# Patient Record
Sex: Female | Born: 1945 | Race: White | Hispanic: No | Marital: Single | Smoking: Former smoker
Health system: Southern US, Community
[De-identification: ages and names within clinical notes are randomized; demographics above are authoritative.]

## PROBLEM LIST (undated history)

## (undated) DIAGNOSIS — J449 Chronic obstructive pulmonary disease, unspecified: Secondary | ICD-10-CM

## (undated) DIAGNOSIS — I10 Essential (primary) hypertension: Secondary | ICD-10-CM

## (undated) DIAGNOSIS — F32A Depression, unspecified: Secondary | ICD-10-CM

## (undated) DIAGNOSIS — F329 Major depressive disorder, single episode, unspecified: Secondary | ICD-10-CM

## (undated) DIAGNOSIS — T7840XA Allergy, unspecified, initial encounter: Secondary | ICD-10-CM

## (undated) DIAGNOSIS — M1990 Unspecified osteoarthritis, unspecified site: Secondary | ICD-10-CM

## (undated) HISTORY — DX: Depression, unspecified: F32.A

## (undated) HISTORY — PX: ABDOMINAL HYSTERECTOMY: SHX81

## (undated) HISTORY — DX: Unspecified osteoarthritis, unspecified site: M19.90

## (undated) HISTORY — DX: Chronic obstructive pulmonary disease, unspecified: J44.9

## (undated) HISTORY — DX: Essential (primary) hypertension: I10

## (undated) HISTORY — DX: Major depressive disorder, single episode, unspecified: F32.9

## (undated) HISTORY — DX: Allergy, unspecified, initial encounter: T78.40XA

---

## 2000-02-14 ENCOUNTER — Encounter: Admission: RE | Admit: 2000-02-14 | Discharge: 2000-05-14 | Payer: Self-pay | Admitting: Orthopedic Surgery

## 2002-04-16 ENCOUNTER — Encounter: Admission: RE | Admit: 2002-04-16 | Discharge: 2002-04-16 | Payer: Self-pay | Admitting: Internal Medicine

## 2002-04-16 ENCOUNTER — Encounter: Payer: Self-pay | Admitting: Internal Medicine

## 2003-04-29 ENCOUNTER — Encounter: Admission: RE | Admit: 2003-04-29 | Discharge: 2003-04-29 | Payer: Self-pay | Admitting: Internal Medicine

## 2003-10-05 ENCOUNTER — Emergency Department (HOSPITAL_COMMUNITY): Admission: EM | Admit: 2003-10-05 | Discharge: 2003-10-05 | Payer: Self-pay | Admitting: Emergency Medicine

## 2004-04-29 ENCOUNTER — Ambulatory Visit (HOSPITAL_COMMUNITY): Admission: RE | Admit: 2004-04-29 | Discharge: 2004-04-29 | Payer: Self-pay | Admitting: Internal Medicine

## 2005-05-10 ENCOUNTER — Ambulatory Visit (HOSPITAL_COMMUNITY): Admission: RE | Admit: 2005-05-10 | Discharge: 2005-05-10 | Payer: Self-pay | Admitting: Internal Medicine

## 2006-05-25 ENCOUNTER — Ambulatory Visit (HOSPITAL_COMMUNITY): Admission: RE | Admit: 2006-05-25 | Discharge: 2006-05-25 | Payer: Self-pay | Admitting: Internal Medicine

## 2006-06-02 ENCOUNTER — Encounter: Admission: RE | Admit: 2006-06-02 | Discharge: 2006-06-02 | Payer: Self-pay | Admitting: Internal Medicine

## 2007-06-21 ENCOUNTER — Ambulatory Visit (HOSPITAL_COMMUNITY): Admission: RE | Admit: 2007-06-21 | Discharge: 2007-06-21 | Payer: Self-pay | Admitting: Internal Medicine

## 2007-08-17 ENCOUNTER — Encounter (INDEPENDENT_AMBULATORY_CARE_PROVIDER_SITE_OTHER): Payer: Self-pay | Admitting: Emergency Medicine

## 2007-08-17 ENCOUNTER — Ambulatory Visit: Payer: Self-pay | Admitting: Vascular Surgery

## 2007-08-17 ENCOUNTER — Emergency Department (HOSPITAL_COMMUNITY): Admission: EM | Admit: 2007-08-17 | Discharge: 2007-08-17 | Payer: Self-pay | Admitting: Emergency Medicine

## 2008-06-24 ENCOUNTER — Ambulatory Visit (HOSPITAL_COMMUNITY): Admission: RE | Admit: 2008-06-24 | Discharge: 2008-06-24 | Payer: Self-pay | Admitting: Internal Medicine

## 2009-07-07 ENCOUNTER — Ambulatory Visit (HOSPITAL_COMMUNITY): Admission: RE | Admit: 2009-07-07 | Discharge: 2009-07-07 | Payer: Self-pay | Admitting: Internal Medicine

## 2010-12-01 ENCOUNTER — Other Ambulatory Visit (HOSPITAL_COMMUNITY): Payer: Self-pay | Admitting: *Deleted

## 2010-12-01 DIAGNOSIS — Z1231 Encounter for screening mammogram for malignant neoplasm of breast: Secondary | ICD-10-CM

## 2010-12-08 ENCOUNTER — Ambulatory Visit (HOSPITAL_COMMUNITY)
Admission: RE | Admit: 2010-12-08 | Discharge: 2010-12-08 | Disposition: A | Discharge status: Home / Self Care | Payer: Medicare Other | Source: Ambulatory Visit | Attending: *Deleted | Admitting: *Deleted

## 2010-12-08 DIAGNOSIS — Z1231 Encounter for screening mammogram for malignant neoplasm of breast: Secondary | ICD-10-CM | POA: Insufficient documentation

## 2011-01-25 LAB — COMPREHENSIVE METABOLIC PANEL
AST: 21
Alkaline Phosphatase: 77
CO2: 27
Calcium: 9.7
Creatinine, Ser: 0.82
GFR calc non Af Amer: >60
Total Protein: 7.3

## 2011-01-25 LAB — DIFFERENTIAL
Monocytes Absolute: 0.5
Monocytes Relative: 6
Neutro Abs: 5.5
Neutrophils Relative %: 70

## 2011-01-25 LAB — CBC
HCT: 42
Hemoglobin: 14.3
MCV: 86.8
RBC: 4.84
RDW: 13.6
WBC: 7.9

## 2011-05-24 ENCOUNTER — Ambulatory Visit (INDEPENDENT_AMBULATORY_CARE_PROVIDER_SITE_OTHER): Payer: BC Managed Care – PPO

## 2011-05-24 DIAGNOSIS — K5730 Diverticulosis of large intestine without perforation or abscess without bleeding: Secondary | ICD-10-CM

## 2011-05-24 DIAGNOSIS — R319 Hematuria, unspecified: Secondary | ICD-10-CM

## 2011-12-22 ENCOUNTER — Other Ambulatory Visit (HOSPITAL_COMMUNITY): Payer: Self-pay | Admitting: Internal Medicine

## 2011-12-22 DIAGNOSIS — Z1231 Encounter for screening mammogram for malignant neoplasm of breast: Secondary | ICD-10-CM

## 2012-01-05 ENCOUNTER — Ambulatory Visit (HOSPITAL_COMMUNITY)
Admission: RE | Admit: 2012-01-05 | Discharge: 2012-01-05 | Disposition: A | Discharge status: Home / Self Care | Payer: Medicare Other | Source: Ambulatory Visit | Attending: Internal Medicine | Admitting: Internal Medicine

## 2012-01-05 DIAGNOSIS — Z1231 Encounter for screening mammogram for malignant neoplasm of breast: Secondary | ICD-10-CM | POA: Insufficient documentation

## 2012-03-03 ENCOUNTER — Ambulatory Visit (INDEPENDENT_AMBULATORY_CARE_PROVIDER_SITE_OTHER): Payer: BC Managed Care – PPO | Admitting: Physician Assistant

## 2012-03-03 VITALS — BP 130/90 | HR 80 | Temp 98.8°F | Resp 18 | Wt 237.0 lb

## 2012-03-03 DIAGNOSIS — I10 Essential (primary) hypertension: Secondary | ICD-10-CM | POA: Insufficient documentation

## 2012-03-03 DIAGNOSIS — R319 Hematuria, unspecified: Secondary | ICD-10-CM

## 2012-03-03 DIAGNOSIS — H8390 Unspecified disease of inner ear, unspecified ear: Secondary | ICD-10-CM | POA: Insufficient documentation

## 2012-03-03 DIAGNOSIS — F419 Anxiety disorder, unspecified: Secondary | ICD-10-CM | POA: Insufficient documentation

## 2012-03-03 DIAGNOSIS — M1990 Unspecified osteoarthritis, unspecified site: Secondary | ICD-10-CM | POA: Insufficient documentation

## 2012-03-03 LAB — POCT URINALYSIS DIPSTICK
Bilirubin, UA: NEGATIVE
Ketones, UA: NEGATIVE
Nitrite, UA: NEGATIVE
Spec Grav, UA: 1.02
Urobilinogen, UA: 0.2
pH, UA: 7

## 2012-03-03 LAB — POCT UA - MICROSCOPIC ONLY
Casts, Ur, LPF, POC: NEGATIVE
Crystals, Ur, HPF, POC: NEGATIVE
Yeast, UA: NEGATIVE

## 2012-03-03 MED ORDER — CIPROFLOXACIN HCL 500 MG PO TABS: 500.0000 mg | ORAL_TABLET | Freq: Two times a day (BID) | ORAL | Status: DC

## 2012-03-03 NOTE — Patient Instructions (Signed)
Get plenty of rest and drink at least 64 ounces of water daily. 

## 2012-03-03 NOTE — Progress Notes (Signed)
Subjective:    Patient ID: Kathy Lucas, female    DOB: [birth year], 66 y.o.   MRN: [id number]  HPI This 66 y.o. female presents for evaluation of "a kidney infection."  "I also have diverticulitis and over the past few weeks it's been flared up.  The past few days I've been impacted.  I used a Fleet's enema and suppositories with good results, and I'm emptied out.  But, I developed a hemorrhoid during the process.  Then developed an odor to the urine and had blood in my urine (x1 on 03/01/2012)."  Feels tired and achy.  Did not take torsemide this morning in order to give a "true urine sample" here today.  Mild burning with urination.  Increased urinary frequency, but not urgency.  No fever or chills.  No nausea or vomiting.  Review of Systems As above.   Past Medical History  Diagnosis Date  . Allergy   . Arthritis   . Hypertension     Past Surgical History  Procedure Date  . Cesarean section     Prior to Admission medications   Medication Sig Start Date End Date Taking? Authorizing Provider  ALPRAZolam (XANAX) 0.25 MG tablet Take 0.25 mg by mouth at bedtime as needed.   Yes Historical Provider, MD  atenolol (TENORMIN) 50 MG tablet Take 50 mg by mouth daily.   Yes Historical Provider, MD  meclizine (ANTIVERT) 12.5 MG tablet Take 12.5 mg by mouth 3 (three) times daily as needed.   Yes Historical Provider, MD  torsemide (DEMADEX) 10 MG tablet Take 10 mg by mouth daily.   Yes Historical Provider, MD    Allergies  Allergen Reactions  . Sulfa Antibiotics     History   Social History  . Marital Status: Single    Spouse Name: n/a (Divorced)    Number of Children: 2  . Years of Education: 12+   Occupational History  . Property Manager    Social History Main Topics  . Smoking status: Former Games developer  . Smokeless tobacco: Never Used  . Alcohol Use: No  . Drug Use: No  . Sexually Active: No   Other Topics Concern  . Not on file   Social History Narrative   Lives alone.   Daughter, Kathy Lucas, lives nearby.    Family History  Problem Relation Age of Onset  . Cancer Sister 25    metastatic lung (+tobacco)       Objective:   Physical Exam Blood pressure 130/90, pulse 80, temperature 98.8 F (37.1 C), temperature source Oral, resp. rate 18, weight 237 lb (107.502 kg). There is no height on file to calculate BMI. Well-developed, well nourished WF who is awake, alert and oriented, in NAD. HEENT: [place]/AT, sclera and conjunctiva are clear.   Neck: supple, non-tender, no lymphadenopathy, thyromegaly. Heart: RRR, no murmur Lungs: normal effort, CTA Abdomen: normo-active bowel sounds, supple, no mass or organomegaly. Mild tenderness with palpation of the suprapubic region.  No CVA tenderness. Extremities: no cyanosis, clubbing or edema. Skin: warm and dry without rash. Psychologic: good mood and appropriate affect, normal speech and behavior.  Results for orders placed in visit on 03/03/12  POCT URINALYSIS DIPSTICK      Component Value Range   Color, UA yellow     Clarity, UA cloudy     Glucose, UA neg     Bilirubin, UA neg     Ketones, UA neg     Spec Grav, UA 1.020     Blood,  UA large     pH, UA 7.0     Protein, UA 30     Urobilinogen, UA 0.2     Nitrite, UA neg     Leukocytes, UA moderate (2+)    POCT UA - MICROSCOPIC ONLY      Component Value Range   WBC, Ur, HPF, POC tntc     RBC, urine, microscopic tntc     Bacteria, U Microscopic 4+     Mucus, UA neg     Epithelial cells, urine per micros neg     Crystals, Ur, HPF, POC neg     Casts, Ur, LPF, POC neg     Yeast, UA neg        Assessment & Plan:   1. Hematuria  POCT urinalysis dipstick, POCT UA - Microscopic Only, Urine culture, ciprofloxacin (CIPRO) 500 MG tablet   Anticipatory guidance provided.

## 2012-03-06 ENCOUNTER — Encounter: Payer: Self-pay | Admitting: Physician Assistant

## 2012-03-06 LAB — URINE CULTURE

## 2012-06-27 ENCOUNTER — Ambulatory Visit (INDEPENDENT_AMBULATORY_CARE_PROVIDER_SITE_OTHER): Payer: BC Managed Care – PPO | Admitting: Family Medicine

## 2012-06-27 VITALS — BP 162/73 | HR 57 | Temp 98.1°F | Resp 16 | Ht 60.0 in | Wt 235.0 lb

## 2012-06-27 DIAGNOSIS — K5732 Diverticulitis of large intestine without perforation or abscess without bleeding: Secondary | ICD-10-CM

## 2012-06-27 DIAGNOSIS — K625 Hemorrhage of anus and rectum: Secondary | ICD-10-CM

## 2012-06-27 LAB — POCT CBC
Granulocyte percent: 58.6 %G (ref 37–80)
Hemoglobin: 14.2 g/dL (ref 12.2–16.2)
Lymph, poc: 3 (ref 0.6–3.4)
MCV: 91.6 fL (ref 80–97)
MID (cbc): 0.6 (ref 0–0.9)
MPV: 8.7 fL (ref 0–99.8)
POC Granulocyte: 5.1 (ref 2–6.9)
POC LYMPH PERCENT: 34.3 %L (ref 10–50)
RBC: 4.83 M/uL (ref 4.04–5.48)

## 2012-06-27 MED ORDER — CIPROFLOXACIN HCL 500 MG PO TABS: 500.0000 mg | ORAL_TABLET | Freq: Two times a day (BID) | ORAL | Status: DC

## 2012-06-27 MED ORDER — METRONIDAZOLE 500 MG PO TABS: 500.0000 mg | ORAL_TABLET | Freq: Three times a day (TID) | ORAL | Status: DC

## 2012-06-27 MED ORDER — ONDANSETRON 8 MG PO TBDP: 8.0000 mg | ORAL_TABLET | Freq: Three times a day (TID) | ORAL | Status: DC | PRN

## 2012-06-27 NOTE — Patient Instructions (Signed)
Diverticulitis °A diverticulum is a small pouch or sac on the colon. Diverticulosis is the presence of these diverticula on the colon. Diverticulitis is the irritation (inflammation) or infection of diverticula. °CAUSES  °The colon and its diverticula contain bacteria. If food particles block the tiny opening to a diverticulum, the bacteria inside can grow and cause an increase in pressure. This leads to infection and inflammation and is called diverticulitis. °SYMPTOMS  °· Abdominal pain and tenderness. Usually, the pain is located on the left side of your abdomen. However, it could be located elsewhere. °· Fever. °· Bloating. °· Feeling sick to your stomach (nausea). °· Throwing up (vomiting). °· Abnormal stools. °DIAGNOSIS  °Your caregiver will take a history and perform a physical exam. Since many things can cause abdominal pain, other tests may be necessary. Tests may include: °· Blood tests. °· Urine tests. °· X-ray of the abdomen. °· CT scan of the abdomen. °Sometimes, surgery is needed to determine if diverticulitis or other conditions are causing your symptoms. °TREATMENT  °Most of the time, you can be treated without surgery. Treatment includes: °· Resting the bowels by only having liquids for a few days. As you improve, you will need to eat a low-fiber diet. °· Intravenous (IV) fluids if you are losing body fluids (dehydrated). °· Antibiotic medicines that treat infections may be given. °· Pain and nausea medicine, if needed. °· Surgery if the inflamed diverticulum has burst. °HOME CARE INSTRUCTIONS  °· Try a clear liquid diet (broth, tea, or water for as long as directed by your caregiver). You may then gradually begin a low-fiber diet as tolerated. A low-fiber diet is a diet with less than 10 grams of fiber. Choose the foods below to reduce fiber in the diet: °· White breads, cereals, rice, and pasta. °· Cooked fruits and vegetables or soft fresh fruits and vegetables without the skin. °· Ground or  well-cooked tender beef, ham, veal, lamb, pork, or poultry. °· Eggs and seafood. °· After your diverticulitis symptoms have improved, your caregiver may put you on a high-fiber diet. A high-fiber diet includes 14 grams of fiber for every 1000 calories consumed. For a standard 2000 calorie diet, you would need 28 grams of fiber. Follow these diet guidelines to help you increase the fiber in your diet. It is important to slowly increase the amount fiber in your diet to avoid gas, constipation, and bloating. °· Choose whole-grain breads, cereals, pasta, and brown rice. °· Choose fresh fruits and vegetables with the skin on. Do not overcook vegetables because the more vegetables are cooked, the more fiber is lost. °· Choose more nuts, seeds, legumes, dried peas, beans, and lentils. °· Look for food products that have greater than 3 grams of fiber per serving on the Nutrition Facts label. °· Take all medicine as directed by your caregiver. °· If your caregiver has given you a follow-up appointment, it is very important that you go. Not going could result in lasting (chronic) or permanent injury, pain, and disability. If there is any problem keeping the appointment, call to reschedule. °SEEK MEDICAL CARE IF:  °· Your pain does not improve. °· You have a hard time advancing your diet beyond clear liquids. °· Your bowel movements do not return to normal. °SEEK IMMEDIATE MEDICAL CARE IF:  °· Your pain becomes worse. °· You have an oral temperature above 102° F (38.9° C), not controlled by medicine. °· You have repeated vomiting. °· You have bloody or black, tarry stools. °· Symptoms   that brought you to your caregiver become worse or are not getting better. °MAKE SURE YOU:  °· Understand these instructions. °· Will watch your condition. °· Will get help right away if you are not doing well or get worse. °Document Released: 01/26/2005 Document Revised: 07/11/2011 Document Reviewed: 05/24/2010 °[employer]® Patient Information  ©2013 [employer]. ° °

## 2012-06-27 NOTE — Progress Notes (Signed)
Subjective:    Patient ID: Kathy Lucas, female    DOB: 16-Jan-1946, 66 y.o.   MRN: [id number] Chief Complaint  Patient presents with  . Diverticulitis    pt states flare up    HPI  She has had several diverticulitis flairs in the past but this is the worst in the last several years.  She had problems with diverticulitis starting about 3-4 yrs ago confirmed with CT scan by PCP.   Similar sxs started yesterday - abd was sore and cramping in bilateral lower abd but worst on left but even radiating to upper stomach - all over.  And then loose stools started - it was so bad she couldn't even lay still and continued today.  Feels like it did during initial diagnosis sev years ago.   Was able to have a cup of coffee, pop tart, and some crackers today.  Has not been drinking any fluids today other than coffee. Sxs started a couple hrs after eating yesterday. Nauseas last night but resolved today and no vomiting.  No blood in stools or black tarry stools.  Nml urination - on torsemide. Haven't had a colonoscopy ever - but has referral and plans to schedule.  Past Surgical History  Procedure Laterality Date  . Cesarean section    c/s x 2 and hysterectomy - thinks they might have taken her appendix out during one of these but not sure.   Review of Systems  Constitutional: Positive for activity change, appetite change and fatigue. Negative for fever, chills and unexpected weight change.  Respiratory: Negative for shortness of breath.   Cardiovascular: Negative for chest pain and leg swelling.  Gastrointestinal: Positive for nausea, abdominal pain, diarrhea, abdominal distention and rectal pain. Negative for vomiting, constipation, blood in stool and anal bleeding.  Genitourinary: Negative for dysuria, decreased urine volume and difficulty urinating.  Musculoskeletal: Negative for gait problem.  Skin: Negative for rash.  Hematological: Negative for adenopathy.  Psychiatric/Behavioral: Negative for  sleep disturbance.      BP 162/73  Pulse 57  Temp(Src) 98.1 F (36.7 C) (Oral)  Resp 16  Ht 5' (1.524 m)  Wt 235 lb (106.595 kg)  BMI 45.9 kg/m2 Objective:   Physical Exam  Constitutional: She is oriented to person, place, and time. She appears well-developed and well-nourished. No distress.  HENT:  Head: Normocephalic and atraumatic.  Neck: Normal range of motion. Neck supple. No thyromegaly present.  Cardiovascular: Normal rate, regular rhythm, normal heart sounds and intact distal pulses.   Pulmonary/Chest: Effort normal and breath sounds normal. No respiratory distress.  Abdominal: Soft. Bowel sounds are normal. There is no hepatosplenomegaly. There is generalized tenderness. There is no rigidity, no rebound, no guarding and no CVA tenderness.  Tenderness is diffuse but most prevalent in LLQ - no referred pain.  Genitourinary: Rectal exam shows tenderness. Rectal exam shows no external hemorrhoid, no internal hemorrhoid, no fissure, no mass and anal tone normal.  Musculoskeletal: She exhibits no edema.  Lymphadenopathy:    She has no cervical adenopathy.  Neurological: She is alert and oriented to person, place, and time.  Skin: Skin is warm and dry. She is not diaphoretic. No erythema.  Psychiatric: She has a normal mood and affect. Her behavior is normal.       Results for orders placed in visit on 06/27/12  IFOBT (OCCULT BLOOD)      Result Value Range   IFOBT Negative    POCT CBC      Result  Value Range   WBC 8.7  4.6 - 10.2 K/uL   Lymph, poc 3.0  0.6 - 3.4   POC LYMPH PERCENT 34.3  10 - 50 %L   MID (cbc) 0.6  0 - 0.9   POC MID % 7.1  0 - 12 %M   POC Granulocyte 5.1  2 - 6.9   Granulocyte percent 58.6  37 - 80 %G   RBC 4.83  4.04 - 5.48 M/uL   Hemoglobin 14.2  12.2 - 16.2 g/dL   HCT, POC 29.5  62.1 - 47.9 %   MCV 91.6  80 - 97 fL   MCH, POC 29.4  27 - 31.2 pg   MCHC 32.1  31.8 - 35.4 g/dL   RDW, POC 30.8     Platelet Count, POC 406  142 - 424 K/uL   MPV 8.7   0 - 99.8 fL    Assessment & Plan:  Diverticulitis of colon (without mention of hemorrhage) -  Plan: IFOBT POC (occult bld, rslt in office), POCT CBC, CANCELED: CBC - reassuring the HOC negative in office.  Start course of ciprofloxacin (CIPRO) 500 MG tablet, metroNIDAZOLE (FLAGYL) 500 MG tablet, push fluids but otherwise try to rest bowel. Schedule w/ GI asap.  Meds ordered this encounter  Medications  . ciprofloxacin (CIPRO) 500 MG tablet    Sig: Take 1 tablet (500 mg total) by mouth 2 (two) times daily.    Dispense:  20 tablet    Refill:  0  . metroNIDAZOLE (FLAGYL) 500 MG tablet    Sig: Take 1 tablet (500 mg total) by mouth 3 (three) times daily. DO NOT CONSUME ALCOHOL WHILE TAKING THIS MEDICATION.    Dispense:  30 tablet    Refill:  0  . ondansetron (ZOFRAN-ODT) 8 MG disintegrating tablet    Sig: Take 1 tablet (8 mg total) by mouth every 8 (eight) hours as needed for nausea.    Dispense:  30 tablet    Refill:  0

## 2012-06-28 NOTE — Progress Notes (Signed)
Completed prior auth for pt's zofran 8 mg over the phone and received approval through 06/28/13. Faxed approval notice to pharmacy.

## 2012-08-05 ENCOUNTER — Ambulatory Visit (INDEPENDENT_AMBULATORY_CARE_PROVIDER_SITE_OTHER): Payer: BC Managed Care – PPO | Admitting: Emergency Medicine

## 2012-08-05 VITALS — BP 182/93 | HR 66 | Temp 98.3°F | Resp 16 | Ht 60.0 in | Wt 236.0 lb

## 2012-08-05 DIAGNOSIS — J020 Streptococcal pharyngitis: Secondary | ICD-10-CM

## 2012-08-05 MED ORDER — PENICILLIN V POTASSIUM 500 MG PO TABS: 500.0000 mg | ORAL_TABLET | Freq: Four times a day (QID) | ORAL | Status: DC

## 2012-08-05 NOTE — Patient Instructions (Signed)

## 2012-08-05 NOTE — Progress Notes (Signed)
[facility] [address], [place] [address] [phone]  Date:  08/05/2012   Name:  Kathy Lucas   DOB:  December 18, 1945   MRN:  [id number]  PCP:  No PCP Per Patient    Chief Complaint: URI   History of Present Illness:  Kathy Lucas is a 67 y.o. very pleasant female patient who presents with the following:  Ill with sore throat yesterday on arising.  Has malaise and arthralgia.  No fever or chills.  No coryza or cough.  No nausea or vomiting.  No wheezing or shortness of breath.  No stool change.  No rash.   No improvement with over the counter medications or other home remedies. Denies other complaint or health concern today.   Patient Active Problem List  Diagnosis  . HTN (hypertension)  . Arthritis  . Anxiety  . Inner ear dysfunction    Past Medical History  Diagnosis Date  . Allergy   . Arthritis   . Hypertension   . Depression     Past Surgical History  Procedure Laterality Date  . Cesarean section      History  Substance Use Topics  . Smoking status: Former Games developer  . Smokeless tobacco: Never Used  . Alcohol Use: No    Family History  Problem Relation Age of Onset  . Cancer Sister 67    metastatic lung (+tobacco)  . Alcohol abuse Sister     Allergies  Allergen Reactions  . Sulfa Antibiotics     Medication list has been reviewed and updated.  Current Outpatient Prescriptions on File Prior to Visit  Medication Sig Dispense Refill  . ALPRAZolam (XANAX) 0.25 MG tablet Take 0.25 mg by mouth at bedtime as needed.      Marland Kitchen atenolol (TENORMIN) 50 MG tablet Take 50 mg by mouth daily.      . meclizine (ANTIVERT) 12.5 MG tablet Take 12.5 mg by mouth 3 (three) times daily as needed.      . ondansetron (ZOFRAN-ODT) 8 MG disintegrating tablet Take 1 tablet (8 mg total) by mouth every 8 (eight) hours as needed for nausea.  30 tablet  0  . torsemide (DEMADEX) 10 MG tablet Take 10 mg by mouth daily.      . ciprofloxacin (CIPRO) 500 MG  tablet Take 1 tablet (500 mg total) by mouth 2 (two) times daily.  20 tablet  0  . metroNIDAZOLE (FLAGYL) 500 MG tablet Take 1 tablet (500 mg total) by mouth 3 (three) times daily. DO NOT CONSUME ALCOHOL WHILE TAKING THIS MEDICATION.  30 tablet  0   No current facility-administered medications on file prior to visit.    Review of Systems:  As per HPI, otherwise negative.  Marland Kitchen  Physical Examination: Filed Vitals:   08/05/12 1536  BP: 182/93  Pulse: 66  Temp: 98.3 F (36.8 C)  Resp: 16   Filed Vitals:   08/05/12 1536  Height: 5' (1.524 m)  Weight: 236 lb (107.049 kg)   Body mass index is 46.09 kg/(m^2). Ideal Body Weight: Weight in (lb) to have BMI = 25: 127.7  GEN: morbidly obese, NAD, Non-toxic, A & O x 3 HEENT: Atraumatic, Normocephalic. Neck supple. No masses, No LAD.  Exudative pharyngitis Ears and Nose: No external deformity. CV: RRR, No M/G/R. No JVD. No thrill. No extra heart sounds. PULM: CTA B, no wheezes, crackles, rhonchi. No retractions. No resp. distress. No accessory muscle use. ABD: S, NT, ND, +BS. No rebound.  No HSM. EXTR: No c/c/e NEURO Normal gait.  PSYCH: Normally interactive. Conversant. Not depressed or anxious appearing.  Calm demeanor.    Assessment and Plan: Strep pharyngitis Pen v k   Signed,  Phillips Odor, MD

## 2012-08-06 ENCOUNTER — Telehealth: Payer: Self-pay

## 2012-08-06 NOTE — Telephone Encounter (Signed)
Pt seen yesterday for strep w/dr Dareen Piano. States her work does not want her to come back until shes not contagious anymore. reviewd pts symptoms with Delaney Meigs and she reviewd with dr at nurses station and confirmed 48 hours before not contagious as long as pt is not having a fever and is taking antibiotics rx'd yesterday. Pt confirmed and should be able to return to work on Wednesday. Pt states she needs a note for her job stating she is ok to return Wednesday and is not contagious as of that time. Please call pt with questions. Asks [place] to fax note to her work.  Attn: ellen young @ [employer] Fax: [phone]  bf

## 2012-08-06 NOTE — Telephone Encounter (Signed)
Note printed and faxed.

## 2012-08-06 NOTE — Telephone Encounter (Signed)
She was seen Sunday for Strep, wants out of work note to return on Wed, is this okay?

## 2012-08-06 NOTE — Telephone Encounter (Signed)
Yes, please.

## 2012-08-09 ENCOUNTER — Ambulatory Visit (INDEPENDENT_AMBULATORY_CARE_PROVIDER_SITE_OTHER): Payer: BC Managed Care – PPO | Admitting: Family Medicine

## 2012-08-09 VITALS — BP 140/65 | HR 72 | Temp 98.5°F | Resp 20 | Ht 60.25 in | Wt 233.0 lb

## 2012-08-09 DIAGNOSIS — J019 Acute sinusitis, unspecified: Secondary | ICD-10-CM

## 2012-08-09 DIAGNOSIS — J111 Influenza due to unidentified influenza virus with other respiratory manifestations: Secondary | ICD-10-CM

## 2012-08-09 DIAGNOSIS — J209 Acute bronchitis, unspecified: Secondary | ICD-10-CM

## 2012-08-09 DIAGNOSIS — R6889 Other general symptoms and signs: Secondary | ICD-10-CM

## 2012-08-09 DIAGNOSIS — IMO0001 Reserved for inherently not codable concepts without codable children: Secondary | ICD-10-CM

## 2012-08-09 DIAGNOSIS — M791 Myalgia, unspecified site: Secondary | ICD-10-CM

## 2012-08-09 DIAGNOSIS — J029 Acute pharyngitis, unspecified: Secondary | ICD-10-CM

## 2012-08-09 LAB — POCT CBC
Granulocyte percent: 80.5 %G — AB (ref 37–80)
HCT, POC: 41.5 % (ref 37.7–47.9)
Hemoglobin: 12.7 g/dL (ref 12.2–16.2)
MCH, POC: 28.1 pg (ref 27–31.2)
MCV: 91.9 fL (ref 80–97)
MID (cbc): 0.9 (ref 0–0.9)
RBC: 4.52 M/uL (ref 4.04–5.48)
WBC: 14.9 10*3/uL — AB (ref 4.6–10.2)

## 2012-08-09 LAB — POCT INFLUENZA A/B
Influenza A, POC: NEGATIVE
Influenza B, POC: NEGATIVE

## 2012-08-09 MED ORDER — BENZONATATE 100 MG PO CAPS: ORAL_CAPSULE | ORAL | Status: DC

## 2012-08-09 MED ORDER — HYDROCODONE-HOMATROPINE 5-1.5 MG/5ML PO SYRP: 5.0000 mL | ORAL_SOLUTION | ORAL | Status: DC | PRN

## 2012-08-09 MED ORDER — LEVOFLOXACIN 500 MG PO TABS: 500.0000 mg | ORAL_TABLET | Freq: Every day | ORAL | Status: DC

## 2012-08-09 NOTE — Progress Notes (Signed)
Subjective: Patient has been having the persistent sore throat. She came in here Sunday and saw Dr. Dareen Piano. Was treated with penicillin for strep throat, however did not have a strep test done. She is gotten worse since then with head congestion, severe sore throat still. She has a cough. She aches all over. She says her hurts to touch her eyelids.  Objective: Patient looks ill. Her TMs are normal. Throat erythematous without exudate. Strep screen and culture were taken. Flu swab was taken. Neck supple with small nodes. Chest is clear to auscultation without wheezing. Heart regular without murmurs.   Results for orders placed in visit on 08/09/12  POCT CBC      Result Value Range   WBC 14.9 (*) 4.6 - 10.2 K/uL   Lymph, poc 2.0  0.6 - 3.4   POC LYMPH PERCENT 13.5  10 - 50 %L   MID (cbc) 0.9  0 - 0.9   POC MID % 6.0  0 - 12 %M   POC Granulocyte 12.0 (*) 2 - 6.9   Granulocyte percent 80.5 (*) 37 - 80 %G   RBC 4.52  4.04 - 5.48 M/uL   Hemoglobin 12.7  12.2 - 16.2 g/dL   HCT, POC 16.1  09.6 - 47.9 %   MCV 91.9  80 - 97 fL   MCH, POC 28.1  27 - 31.2 pg   MCHC 30.6 (*) 31.8 - 35.4 g/dL   RDW, POC 04.5     Platelet Count, POC 348  142 - 424 K/uL   MPV 8.8  0 - 99.8 fL  POCT INFLUENZA A/B      Result Value Range   Influenza A, POC Negative     Influenza B, POC Negative    POCT RAPID STREP A (OFFICE)      Result Value Range   Rapid Strep A Screen Negative  Negative   Assessment: Sinusitis/bronchitis/pharyngitis/viral syndrome with probable bacterial component giving the leukocytosis Plan: Antibiotics and cough medicine. Return if not improving. Cultures pending.

## 2012-08-09 NOTE — Patient Instructions (Addendum)
Drink plenty of fluids and get enough rest  Take the cough syrup at nighttime or when you don't mind being sedated. Use the cough pills in the daytime or when you need to be more alert  Take the antibiotic one daily  Return if problems

## 2012-08-13 ENCOUNTER — Telehealth: Payer: Self-pay

## 2012-08-13 NOTE — Telephone Encounter (Signed)
Notes Recorded by Watt Climes on 08/13/2012 at 3:54 PM Left message for patient to return call for lab results. ------  Notes Recorded by Peyton Najjar, MD on 08/13/2012 at 3:47 PM Please call patient. Lab results were normal.   Now she has four. Called again, she did not answer.

## 2012-08-13 NOTE — Telephone Encounter (Signed)
Spoke to her, she is much better. The meds are helping.

## 2012-08-13 NOTE — Telephone Encounter (Signed)
Pt is calling back, she states that she has had 3 miss calls.

## 2012-10-25 ENCOUNTER — Ambulatory Visit (INDEPENDENT_AMBULATORY_CARE_PROVIDER_SITE_OTHER): Payer: BC Managed Care – PPO | Admitting: Family Medicine

## 2012-10-25 VITALS — BP 152/85 | HR 81 | Temp 98.4°F | Resp 16 | Ht 60.0 in | Wt 240.0 lb

## 2012-10-25 DIAGNOSIS — K5732 Diverticulitis of large intestine without perforation or abscess without bleeding: Secondary | ICD-10-CM

## 2012-10-25 DIAGNOSIS — N390 Urinary tract infection, site not specified: Secondary | ICD-10-CM

## 2012-10-25 DIAGNOSIS — R300 Dysuria: Secondary | ICD-10-CM

## 2012-10-25 LAB — POCT URINALYSIS DIPSTICK
Glucose, UA: NEGATIVE
Ketones, UA: NEGATIVE
Spec Grav, UA: 1.02
pH, UA: 5

## 2012-10-25 LAB — POCT UA - MICROSCOPIC ONLY: Yeast, UA: NEGATIVE

## 2012-10-25 MED ORDER — CIPROFLOXACIN HCL 500 MG PO TABS: 500.0000 mg | ORAL_TABLET | Freq: Two times a day (BID) | ORAL | Status: DC

## 2012-10-25 NOTE — Progress Notes (Signed)
Subjective: 67 year old lady who has been having some dysuria since Monday. She's been urinating more frequently, has urgency, has sensation of incomplete emptying. She's not been running any fever. She did have some bladder problems years ago that she had to have her urethra dilated. That has not been a big problem since. A time or 2 a year though she will get an infection has to be treated.  She did have some nausea but no vomiting.  Objective: No CVA tenderness. Abdomen soft.  Results for orders placed in visit on 10/25/12  POCT URINALYSIS DIPSTICK      Result Value Range   Color, UA yellow     Clarity, UA cloudy     Glucose, UA neg     Bilirubin, UA neg     Ketones, UA neg     Spec Grav, UA 1.020     Blood, UA trace     pH, UA 5.0     Protein, UA neg     Urobilinogen, UA 0.2     Nitrite, UA positive     Leukocytes, UA moderate (2+)    POCT UA - MICROSCOPIC ONLY      Result Value Range   WBC, Ur, HPF, POC TNTC     RBC, urine, microscopic 2-4     Bacteria, U Microscopic 2+     Mucus, UA trace     Epithelial cells, urine per micros 5-8     Crystals, Ur, HPF, POC neg     Casts, Ur, LPF, POC neg     Yeast, UA neg     Assessment: UTI with too numerous to count WBCs in urine  Plan: cipro

## 2012-10-25 NOTE — Patient Instructions (Signed)
Urinary Tract Infection Urinary tract infections (UTIs) can develop anywhere along your urinary tract. Your urinary tract is your body's drainage system for removing wastes and extra water. Your urinary tract includes two kidneys, two ureters, a bladder, and a urethra. Your kidneys are a pair of bean-shaped organs. Each kidney is about the size of your fist. They are located below your ribs, one on each side of your spine. CAUSES Infections are caused by microbes, which are microscopic organisms, including fungi, viruses, and bacteria. These organisms are so small that they can only be seen through a microscope. Bacteria are the microbes that most commonly cause UTIs. SYMPTOMS  Symptoms of UTIs may vary by age and gender of the patient and by the location of the infection. Symptoms in young women typically include a frequent and intense urge to urinate and a painful, burning feeling in the bladder or urethra during urination. Older women and men are more likely to be tired, shaky, and weak and have muscle aches and abdominal pain. A fever may mean the infection is in your kidneys. Other symptoms of a kidney infection include pain in your back or sides below the ribs, nausea, and vomiting. DIAGNOSIS To diagnose a UTI, your caregiver will ask you about your symptoms. Your caregiver also will ask to provide a urine sample. The urine sample will be tested for bacteria and white blood cells. White blood cells are made by your body to help fight infection. TREATMENT  Typically, UTIs can be treated with medication. Because most UTIs are caused by a bacterial infection, they usually can be treated with the use of antibiotics. The choice of antibiotic and length of treatment depend on your symptoms and the type of bacteria causing your infection. HOME CARE INSTRUCTIONS  If you were prescribed antibiotics, take them exactly as your caregiver instructs you. Finish the medication even if you feel better after you  have only taken some of the medication.  Drink enough water and fluids to keep your urine clear or pale yellow.  Avoid caffeine, tea, and carbonated beverages. They tend to irritate your bladder.  Empty your bladder often. Avoid holding urine for long periods of time.  Empty your bladder before and after sexual intercourse.  After a bowel movement, women should cleanse from front to back. Use each tissue only once. SEEK MEDICAL CARE IF:   You have back pain.  You develop a fever.  Your symptoms do not begin to resolve within 3 days. SEEK IMMEDIATE MEDICAL CARE IF:   You have severe back pain or lower abdominal pain.  You develop chills.  You have nausea or vomiting.  You have continued burning or discomfort with urination. MAKE SURE YOU:   Understand these instructions.  Will watch your condition.  Will get help right away if you are not doing well or get worse. Document Released: 01/26/2005 Document Revised: 10/18/2011 Document Reviewed: 05/27/2011 [facility] Patient Information 2014 [place], [place].    Cipro twice daily

## 2012-10-28 LAB — URINE CULTURE

## 2013-03-10 ENCOUNTER — Ambulatory Visit: Payer: Medicare Other

## 2013-03-10 ENCOUNTER — Ambulatory Visit (INDEPENDENT_AMBULATORY_CARE_PROVIDER_SITE_OTHER): Payer: Medicare Other | Admitting: Internal Medicine

## 2013-03-10 VITALS — BP 144/82 | HR 64 | Temp 98.0°F | Resp 18 | Wt 239.0 lb

## 2013-03-10 DIAGNOSIS — N390 Urinary tract infection, site not specified: Secondary | ICD-10-CM

## 2013-03-10 DIAGNOSIS — Z6841 Body Mass Index (BMI) 40.0 and over, adult: Secondary | ICD-10-CM | POA: Insufficient documentation

## 2013-03-10 DIAGNOSIS — M25519 Pain in unspecified shoulder: Secondary | ICD-10-CM

## 2013-03-10 DIAGNOSIS — R350 Frequency of micturition: Secondary | ICD-10-CM

## 2013-03-10 DIAGNOSIS — M25511 Pain in right shoulder: Secondary | ICD-10-CM

## 2013-03-10 DIAGNOSIS — R3915 Urgency of urination: Secondary | ICD-10-CM

## 2013-03-10 LAB — POCT URINALYSIS DIPSTICK
Bilirubin, UA: NEGATIVE
Blood, UA: NEGATIVE
Glucose, UA: NEGATIVE
Nitrite, UA: NEGATIVE
Protein, UA: 30
Spec Grav, UA: 1.015
Urobilinogen, UA: 0.2
pH, UA: 7

## 2013-03-10 LAB — POCT UA - MICROSCOPIC ONLY
Crystals, Ur, HPF, POC: NEGATIVE
Mucus, UA: NEGATIVE
Yeast, UA: NEGATIVE

## 2013-03-10 MED ORDER — MELOXICAM 15 MG PO TABS: 15.0000 mg | ORAL_TABLET | Freq: Every day | ORAL | Status: DC

## 2013-03-10 MED ORDER — CIPROFLOXACIN HCL 250 MG PO TABS: 250.0000 mg | ORAL_TABLET | Freq: Two times a day (BID) | ORAL | Status: DC

## 2013-03-10 NOTE — Progress Notes (Addendum)
°  Subjective:    Patient ID: Kathy Lucas, female    DOB: 02/15/1946, 67 y.o.   MRN: [id number]  HPI HPI Comments: Kathy Lucas is a 67 y.o. female who presents to the [facility] complaining of right shoulder pain radiating to the elbow. Pt was caring for her 30  lb grandson for multiple days last week, and lifting him. The shoulder is now improving tho still signi pain. Pt could not sleep on her right side til last pm. She used 3 pillows to sleep on for support. She has taken tylenol and applied gels to the effected area for relief.  Pt has a hx of arthritis of the knee.  Pt has not trouble with her shoulders before.   Pt is also experiencing some urinary frequency and urgency with mild dysuria. Hx utis. Last 6/14.     Review of Systems  Constitutional: Negative for fever.  Gastrointestinal: Negative for abdominal pain.  Genitourinary: Positive for dysuria and frequency. Negative for hematuria, flank pain, difficulty urinating and pelvic pain.  Musculoskeletal: Negative for neck pain.  Psychiatric/Behavioral: Positive for dysphoric mood.       Thurs [name] spent in bed depr at her pain and inabil to do for self--better now       Objective:   Physical Exam  Constitutional: She appears well-developed and well-nourished. No distress.  Cardiovascular: Normal rate.   Pulmonary/Chest: Effort normal.  Abdominal: There is no tenderness. There is no guarding.  Musculoskeletal:  r should tender over all aspects incl bicip groove, into biceps and even at lat epicondyle--pain c/ pro and sup there abdu ag res painful as is abd > 45 Neck rom good but pain over trap and scap No swelling  Neurological:  No sens /mot losses r hand  Skin: No rash noted.  Psychiatric: She has a normal mood and affect.     U/a tntc wbc    [facility] reading (PRIMARY) by  Dr. Merla Riches degn changes --no fx/disloc   Assessment & Plan:  Urinary tract infection, site not specified  Frequent  urination - Plan: POCT urinalysis dipstick, POCT UA - Microscopic Only  Urinary urgency - Plan: POCT urinalysis dipstick, POCT UA - Microscopic Only  Pain in joint, shoulder region, right - Plan: DG Shoulder Right  Meds ordered this encounter  Medications   acetaminophen (TYLENOL) 500 MG tablet    Sig: Take 500 mg by mouth every 6 (six) hours as needed.   meloxicam (MOBIC) 15 MG tablet    Sig: Take 1 tablet (15 mg total) by mouth daily.    Dispense:  30 tablet    Refill:  0   ciprofloxacin (CIPRO) 250 MG tablet    Sig: Take 1 tablet (250 mg total) by mouth 2 (two) times daily.    Dispense:  10 tablet    Refill:  0   Exercises for should rom F/u 1 week  I have completed the patient encounter in its entirety as documented by the scribe, with editing by me where necessary. Robert P. Merla Riches, M.D.

## 2013-03-27 ENCOUNTER — Ambulatory Visit (INDEPENDENT_AMBULATORY_CARE_PROVIDER_SITE_OTHER): Payer: Medicare Other | Admitting: Emergency Medicine

## 2013-03-27 ENCOUNTER — Ambulatory Visit: Payer: Medicare Other

## 2013-03-27 VITALS — BP 128/82 | HR 68 | Temp 99.5°F | Resp 18 | Ht 59.5 in | Wt 232.6 lb

## 2013-03-27 DIAGNOSIS — J209 Acute bronchitis, unspecified: Secondary | ICD-10-CM

## 2013-03-27 DIAGNOSIS — R062 Wheezing: Secondary | ICD-10-CM

## 2013-03-27 DIAGNOSIS — R05 Cough: Secondary | ICD-10-CM

## 2013-03-27 DIAGNOSIS — J129 Viral pneumonia, unspecified: Secondary | ICD-10-CM

## 2013-03-27 DIAGNOSIS — R059 Cough, unspecified: Secondary | ICD-10-CM

## 2013-03-27 LAB — POCT CBC
Lymph, poc: 2.1 (ref 0.6–3.4)
MCH, POC: 29.3 pg (ref 27–31.2)
MCV: 95.5 fL (ref 80–97)
MID (cbc): 0.8 (ref 0–0.9)
MPV: 8.2 fL (ref 0–99.8)
POC LYMPH PERCENT: 22.4 %L (ref 10–50)
Platelet Count, POC: 362 10*3/uL (ref 142–424)
RBC: 4.91 M/uL (ref 4.04–5.48)
RDW, POC: 14.8 %
WBC: 9.5 10*3/uL (ref 4.6–10.2)

## 2013-03-27 LAB — POCT INFLUENZA A/B: Influenza A, POC: NEGATIVE

## 2013-03-27 MED ORDER — ALBUTEROL SULFATE (2.5 MG/3ML) 0.083% IN NEBU
2.5000 mg | INHALATION_SOLUTION | Freq: Once | RESPIRATORY_TRACT | Status: AC
  Administered 2013-03-27: 2.5 mg via RESPIRATORY_TRACT

## 2013-03-27 MED ORDER — BENZONATATE 100 MG PO CAPS: 100.0000 mg | ORAL_CAPSULE | Freq: Three times a day (TID) | ORAL | Status: DC | PRN

## 2013-03-27 MED ORDER — LEVOFLOXACIN 500 MG PO TABS: 500.0000 mg | ORAL_TABLET | Freq: Every day | ORAL | Status: DC

## 2013-03-27 MED ORDER — ALBUTEROL SULFATE HFA 108 (90 BASE) MCG/ACT IN AERS: 2.0000 | INHALATION_SPRAY | RESPIRATORY_TRACT | Status: DC | PRN

## 2013-03-27 NOTE — Patient Instructions (Signed)

## 2013-03-27 NOTE — Progress Notes (Signed)
Subjective:  This chart was scribed for [name], MD by Carl Best, Medical Scribe. This patient was seen in Room 14 and the patient's care was started at 3:36 PM.   Patient ID: Kathy Lucas, female    DOB: [birth year], 67 y.o.   MRN: [id number]  HPI HPI Comments: Kathy Lucas is a 67 y.o. female who presents to the [facility] complaining of a constant sore throat and an intermittently cough producing clear sputum that started two weeks ago.  The patient states that she worked Monday and Friday of last week but was unable to work during the rest of the week.  She states that she was bedridden all weekend and went back to work on Monday of this week.  She states that she did not feel well enough to go to work today.  She lists bilateral shoulder pain, mild SOB, and rhinorrhea that produces clear mucous when she blows her nose as associated symptoms.  She states that when she coughs she will experience back pain.  The patient states that she has taken Mucinex with no relief in her symptoms.  She states that she did not receive her flu shot but did have her pneumonia shot.  She states that she has not smoked in 18 years.  She denies having a personal history of allergies and states that she has never needed to use a nebulizer in the past.  She states that she was diagnosed with bronchitis in April.  Past Medical History  Diagnosis Date  . Allergy   . Arthritis   . Hypertension   . Depression    Past Surgical History  Procedure Laterality Date  . Cesarean section    . Abdominal hysterectomy     Family History  Problem Relation Age of Onset  . Cancer Sister 69    metastatic lung (+tobacco)  . Alcohol abuse Sister    History   Social History  . Marital Status: Single    Spouse Name: n/a (Divorced)    Number of Children: 2  . Years of Education: 12+   Occupational History  . Property Manager    Social History Main Topics  . Smoking status: Former Games developer    . Smokeless tobacco: Never Used  . Alcohol Use: No  . Drug Use: No  . Sexual Activity: No   Other Topics Concern  . Not on file   Social History Narrative   Lives alone.  Daughter, [name], lives nearby.   Allergies  Allergen Reactions  . Sulfa Antibiotics    Current outpatient prescriptions:acetaminophen (TYLENOL) 500 MG tablet, Take 500 mg by mouth every 6 (six) hours as needed., Disp: , Rfl: ;  ALPRAZolam (XANAX) 0.25 MG tablet, Take 0.25 mg by mouth at bedtime as needed., Disp: , Rfl: ;  atenolol (TENORMIN) 50 MG tablet, Take 50 mg by mouth daily., Disp: , Rfl: ;  meclizine (ANTIVERT) 12.5 MG tablet, Take 12.5 mg by mouth 3 (three) times daily as needed., Disp: , Rfl:  torsemide (DEMADEX) 10 MG tablet, Take 10 mg by mouth daily., Disp: , Rfl: ;  ciprofloxacin (CIPRO) 250 MG tablet, Take 1 tablet (250 mg total) by mouth 2 (two) times daily., Disp: 10 tablet, Rfl: 0;  meloxicam (MOBIC) 15 MG tablet, Take 1 tablet (15 mg total) by mouth daily., Disp: 30 tablet, Rfl: 0    Review of Systems  Constitutional: Positive for activity change and fatigue. Negative for fever.  HENT: Positive for  rhinorrhea and sneezing.   Respiratory: Positive for cough and shortness of breath.   Musculoskeletal: Positive for arthralgias (bilateral shoulders) and back pain (when coughing).     Objective:  Physical Exam  Nursing note and vitals reviewed. Constitutional: She is oriented to person, place, and time. She appears well-developed and well-nourished. No distress.  HENT:  Head: Normocephalic and atraumatic.  Right Ear: External ear normal.  Left Ear: External ear normal.  Nose: Nose normal.  Mouth/Throat: Oropharynx is clear and moist.  Eyes: EOM are normal. Pupils are equal, round, and reactive to light.  Neck: Normal range of motion. Neck supple. No thyromegaly present.  Cardiovascular: Normal rate and regular rhythm.  Exam reveals no gallop and no friction rub.   No murmur  heard. Pulmonary/Chest: Effort normal. No respiratory distress. She has wheezes. She has no rales.  Rhonchi with bilateral wheezes in the bases.   Abdominal: Soft. There is no tenderness.  Musculoskeletal: Normal range of motion.  Lymphadenopathy:    She has no cervical adenopathy.  Neurological: She is alert and oriented to person, place, and time.  Skin: Skin is warm and dry.  Psychiatric: She has a normal mood and affect. Her behavior is normal.   Results for orders placed in visit on 03/27/13  POCT CBC      Result Value Range   WBC 9.5  4.6 - 10.2 K/uL   Lymph, poc 2.1  0.6 - 3.4   POC LYMPH PERCENT 22.4  10 - 50 %L   MID (cbc) 0.8  0 - 0.9   POC MID % 8.3  0 - 12 %M   POC Granulocyte 6.6  2 - 6.9   Granulocyte percent 69.3  37 - 80 %G   RBC 4.91  4.04 - 5.48 M/uL   Hemoglobin 14.4  12.2 - 16.2 g/dL   HCT, POC 16.1  09.6 - 47.9 %   MCV 95.5  80 - 97 fL   MCH, POC 29.3  27 - 31.2 pg   MCHC 30.7 (*) 31.8 - 35.4 g/dL   RDW, POC 04.5     Platelet Count, POC 362  142 - 424 K/uL   MPV 8.2  0 - 99.8 fL  POCT INFLUENZA A/B      Result Value Range   Influenza A, POC Negative     Influenza B, POC Negative     [facility] reading (PRIMARY) by  Dr. Cleta Alberts there appears to be an increase in interstitial markings but no consolidated infiltrates      Assessment & Plan:

## 2013-03-30 ENCOUNTER — Telehealth: Payer: Self-pay | Admitting: Family Medicine

## 2013-03-30 ENCOUNTER — Ambulatory Visit (INDEPENDENT_AMBULATORY_CARE_PROVIDER_SITE_OTHER): Payer: Medicare Other | Admitting: Emergency Medicine

## 2013-03-30 ENCOUNTER — Observation Stay (HOSPITAL_COMMUNITY)
Admission: AD | Admit: 2013-03-30 | Discharge: 2013-04-01 | Disposition: A | Discharge status: Home / Self Care | Payer: Medicare Other | Source: Ambulatory Visit | Attending: Family Medicine | Admitting: Family Medicine

## 2013-03-30 ENCOUNTER — Ambulatory Visit: Payer: Medicare Other

## 2013-03-30 VITALS — BP 162/72 | HR 72 | Temp 99.5°F | Resp 18 | Wt 229.0 lb

## 2013-03-30 DIAGNOSIS — R05 Cough: Secondary | ICD-10-CM | POA: Insufficient documentation

## 2013-03-30 DIAGNOSIS — R059 Cough, unspecified: Secondary | ICD-10-CM | POA: Insufficient documentation

## 2013-03-30 DIAGNOSIS — R062 Wheezing: Secondary | ICD-10-CM

## 2013-03-30 DIAGNOSIS — J441 Chronic obstructive pulmonary disease with (acute) exacerbation: Principal | ICD-10-CM | POA: Diagnosis present

## 2013-03-30 DIAGNOSIS — R109 Unspecified abdominal pain: Secondary | ICD-10-CM

## 2013-03-30 DIAGNOSIS — R0602 Shortness of breath: Secondary | ICD-10-CM | POA: Insufficient documentation

## 2013-03-30 DIAGNOSIS — I10 Essential (primary) hypertension: Secondary | ICD-10-CM | POA: Insufficient documentation

## 2013-03-30 DIAGNOSIS — R110 Nausea: Secondary | ICD-10-CM | POA: Insufficient documentation

## 2013-03-30 DIAGNOSIS — F411 Generalized anxiety disorder: Secondary | ICD-10-CM | POA: Insufficient documentation

## 2013-03-30 DIAGNOSIS — J40 Bronchitis, not specified as acute or chronic: Secondary | ICD-10-CM

## 2013-03-30 DIAGNOSIS — F419 Anxiety disorder, unspecified: Secondary | ICD-10-CM

## 2013-03-30 DIAGNOSIS — R1013 Epigastric pain: Secondary | ICD-10-CM

## 2013-03-30 DIAGNOSIS — R197 Diarrhea, unspecified: Secondary | ICD-10-CM | POA: Insufficient documentation

## 2013-03-30 DIAGNOSIS — E669 Obesity, unspecified: Secondary | ICD-10-CM | POA: Insufficient documentation

## 2013-03-30 DIAGNOSIS — R10816 Epigastric abdominal tenderness: Secondary | ICD-10-CM | POA: Insufficient documentation

## 2013-03-30 LAB — CBC
MCH: 30.3 pg (ref 26.0–34.0)
MCV: 88 fL (ref 78.0–100.0)
Platelets: 357 10*3/uL (ref 150–400)
RDW: 13.4 % (ref 11.5–15.5)
WBC: 12.2 10*3/uL — ABNORMAL HIGH (ref 4.0–10.5)

## 2013-03-30 LAB — COMPREHENSIVE METABOLIC PANEL
ALT: 13 U/L (ref 0–35)
AST: 16 U/L (ref 0–37)
Albumin: 3.2 g/dL — ABNORMAL LOW (ref 3.5–5.2)
Alkaline Phosphatase: 64 U/L (ref 39–117)
CO2: 26 mEq/L (ref 19–32)
Calcium: 9.3 mg/dL (ref 8.4–10.5)
Chloride: 102 mEq/L (ref 96–112)
Creatinine, Ser: 0.82 mg/dL (ref 0.50–1.10)
GFR calc non Af Amer: 72 mL/min — ABNORMAL LOW (ref >90)
Potassium: 4.5 mEq/L (ref 3.5–5.1)
Sodium: 136 mEq/L (ref 135–145)

## 2013-03-30 LAB — URINALYSIS, ROUTINE W REFLEX MICROSCOPIC
Glucose, UA: NEGATIVE mg/dL
Hgb urine dipstick: NEGATIVE
Specific Gravity, Urine: 1.029 (ref 1.005–1.030)
pH: 5.5 (ref 5.0–8.0)

## 2013-03-30 LAB — LIPASE, BLOOD: Lipase: 26 U/L (ref 11–59)

## 2013-03-30 MED ORDER — HEPARIN SODIUM (PORCINE) 5000 UNIT/ML IJ SOLN
5000.0000 [IU] | Freq: Three times a day (TID) | INTRAMUSCULAR | Status: DC
  Administered 2013-03-30 – 2013-04-01 (×5): 5000 [IU] via SUBCUTANEOUS
  Filled 2013-03-30 (×9): qty 1

## 2013-03-30 MED ORDER — ALBUTEROL SULFATE (5 MG/ML) 0.5% IN NEBU
5.0000 mg | INHALATION_SOLUTION | Freq: Four times a day (QID) | RESPIRATORY_TRACT | Status: DC
  Administered 2013-03-30: 2.5 mg via RESPIRATORY_TRACT
  Administered 2013-03-31: 5 mg via RESPIRATORY_TRACT
  Administered 2013-03-31: 2.5 mg via RESPIRATORY_TRACT
  Administered 2013-03-31 – 2013-04-01 (×4): 5 mg via RESPIRATORY_TRACT
  Filled 2013-03-30 (×7): qty 1

## 2013-03-30 MED ORDER — ATENOLOL 50 MG PO TABS
50.0000 mg | ORAL_TABLET | Freq: Every day | ORAL | Status: DC
Start: 2013-03-30 — End: 2013-04-01
  Administered 2013-03-31 – 2013-04-01 (×2): 50 mg via ORAL
  Filled 2013-03-30 (×3): qty 1

## 2013-03-30 MED ORDER — AZITHROMYCIN 500 MG PO TABS
500.0000 mg | ORAL_TABLET | Freq: Once | ORAL | Status: AC
  Administered 2013-03-30: 500 mg via ORAL
  Filled 2013-03-30: qty 1

## 2013-03-30 MED ORDER — IPRATROPIUM BROMIDE 0.02 % IN SOLN
0.5000 mg | Freq: Four times a day (QID) | RESPIRATORY_TRACT | Status: DC
  Administered 2013-03-30 – 2013-04-01 (×7): 0.5 mg via RESPIRATORY_TRACT
  Filled 2013-03-30 (×7): qty 2.5

## 2013-03-30 MED ORDER — ONDANSETRON HCL 4 MG/2ML IJ SOLN
4.0000 mg | Freq: Four times a day (QID) | INTRAMUSCULAR | Status: DC | PRN
  Administered 2013-03-30: 4 mg via INTRAVENOUS
  Filled 2013-03-30: qty 2

## 2013-03-30 MED ORDER — ACETAMINOPHEN 650 MG RE SUPP: 650.0000 mg | Freq: Four times a day (QID) | RECTAL | Status: DC | PRN

## 2013-03-30 MED ORDER — SODIUM CHLORIDE 0.9 % IV SOLN
INTRAVENOUS | Status: DC
  Administered 2013-03-30: 18:00:00 via INTRAVENOUS
  Administered 2013-03-31: 1 mL via INTRAVENOUS
  Administered 2013-04-01: 03:00:00 via INTRAVENOUS

## 2013-03-30 MED ORDER — ALBUTEROL SULFATE (5 MG/ML) 0.5% IN NEBU
5.0000 mg | INHALATION_SOLUTION | RESPIRATORY_TRACT | Status: DC | PRN
  Filled 2013-03-30 (×3): qty 1

## 2013-03-30 MED ORDER — ALPRAZOLAM 0.25 MG PO TABS
0.2500 mg | ORAL_TABLET | Freq: Every evening | ORAL | Status: DC | PRN
  Administered 2013-03-30 – 2013-03-31 (×2): 0.25 mg via ORAL
  Filled 2013-03-30 (×2): qty 1

## 2013-03-30 MED ORDER — ONDANSETRON HCL 4 MG PO TABS: 4.0000 mg | ORAL_TABLET | Freq: Four times a day (QID) | ORAL | Status: DC | PRN

## 2013-03-30 MED ORDER — GUAIFENESIN ER 600 MG PO TB12
600.0000 mg | ORAL_TABLET | Freq: Two times a day (BID) | ORAL | Status: DC | PRN
  Filled 2013-03-30 (×2): qty 1

## 2013-03-30 MED ORDER — ACETAMINOPHEN 325 MG PO TABS
650.0000 mg | ORAL_TABLET | Freq: Four times a day (QID) | ORAL | Status: DC | PRN
  Administered 2013-03-30 – 2013-03-31 (×3): 650 mg via ORAL
  Filled 2013-03-30 (×3): qty 2

## 2013-03-30 MED ORDER — PREDNISONE 50 MG PO TABS
50.0000 mg | ORAL_TABLET | Freq: Every day | ORAL | Status: DC
  Administered 2013-03-31 – 2013-04-01 (×2): 50 mg via ORAL
  Filled 2013-03-30 (×3): qty 1

## 2013-03-30 NOTE — H&P (Signed)
Family Medicine Teaching [facility] Admission History and Physical Service Pager: [phone]  Patient name: Kathy Lucas Medical record number: [id number] Date of birth: [birth year] Age: 67 y.o. Gender: female  Primary Care Provider: [facility] Consultants: None Code Status: Full code  Chief Complaint: Cough, SOB, Recent Abdominal pain  Assessment and Plan: Kathy Lucas is a 67 y.o. female who presents as a direct admission for [facility] with increasing cough, nausea and recent severe abdominal pain. PMH is significant for HTN and Anxiety.  # Productive Cough and SOB - Pulse ox 92% on RA at [facility]. Chest xray done today revealed Bronchitic changes; no focal infiltrate.   - Given smoking history, this is likely an acute COPD exacerbation. Will obtain CBC.  - Will treat with PO Prednisone 50 mg, Duonebs Q6/Albuterol Q2 PRN.  - Starting on Azithromycin given intolerance of Levaquin - Mucinex BID PRN  # Abdominal pain - Unclear etiology, but likely from GI upset secondary to Abx. - Currently, abdominal pain has abated  - Will obtain CMP, Lipase, and UA to assess for underlying pathology - Will continue to monitor closely  # Diarrhea - Given recent abdominal pain and antibiotics will obtain C. Diff PCR. - Will monitor closely during admission.  # HTN - Continuing home Atenolol  # Anxiety - Continuing home PRN Xanax  FEN/GI: Heart Healthy Diet; NS @ 100 mL/hr Prophylaxis: Heparin SQ  Disposition: Pending clinical improvement.  History of Present Illness:  Kathy Lucas is a 67 y.o. female presenting from [facility] with increasing cough, nausea, decrease PO intake and recent severe abdominal pain.  History obtained from patient and from medical record.  Patient reports that she has had productive cough and rhinorrhea  for approximately 2 weeks.  She initially tried OTC Mucinex for her symptoms.  When her cough persisted she presented to [facility] (11/26).  At that time, Xray was obtained and  show interstitial prominence but no focal infiltrate.  She was started on Levaquin for presumed CAP and discharged home with instructions to follow up if she failed to improve.  Patient reports that she took two doses of the Levaquin and developed diarrhea and nausea.  Due to these symptoms she was unable to finish the antibiotic course.    Today, she returned to [facility] due to lack of improvement and recent severe epigastric pain (started after taking the Levaquin). She also reported nausea, diarrhea and decrease PO intake.  In the office, she was found to have diffuse wheezing and crackles.  Her Pulse Ox was 92 % on RA and she was given an Albuterol nebulizer and sent over for direct admission.  Her abdominal pain is now only minimal (reported as a mild discomfort).  Her primary complaint, currently, is productive cough (green, thick sputum) and SOB.    Of note, patient also completed a recent course of Cipro for UTI.  Additionally, patient has a 40 year smoking history (~2 packs/day); she quit 18 years ago.  Review Of Systems: Per HPI with the following additions: Denies dysuria, vomiting. No recent fevers, chills. No recent travel.  Otherwise 12 point review of systems was performed and was unremarkable.  Patient Active Problem List   Diagnosis Date Noted  . COPD exacerbation 03/30/2013  . BMI 45.0-49.9, adult 03/10/2013  . HTN (hypertension) 03/03/2012  . Arthritis 03/03/2012  . Anxiety 03/03/2012  . 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 Allergy   . Arthritis   .  Hypertension   . Depression    Past Surgical History: Past Surgical History  Procedure Laterality Date  . Cesarean section    . Abdominal hysterectomy     Social History: History  Substance Use Topics  . Smoking status: Former Games developer  . Smokeless tobacco: Never Used  . Alcohol Use: No    Family History: Family History  Problem Relation Age of Onset  .  Cancer Sister 41    metastatic lung (+tobacco)  . Alcohol abuse Sister    Allergies and Medications: Allergies  Allergen Reactions  . Levaquin [Levofloxacin In D5w] Nausea And Vomiting  . Sulfa Antibiotics    No current facility-administered medications on file prior to encounter.   Current Outpatient Prescriptions on File Prior to Encounter  Medication Sig Dispense Refill  . benzonatate (TESSALON) 100 MG capsule Take 1-2 capsules (100-200 mg total) by mouth 3 (three) times daily as needed for cough.  40 capsule  0  . meclizine (ANTIVERT) 12.5 MG tablet Take 12.5 mg by mouth 3 (three) times daily as needed for dizziness or nausea.       Marland Kitchen torsemide (DEMADEX) 10 MG tablet Take 10 mg by mouth every morning.        Objective: BP 164/71  Pulse 76  Temp(Src) 97.7 F (36.5 C) (Oral)  Resp 18  SpO2 99% Exam: General: well developed, well nourished. [place] oxygen in placed.  Patient speaking in full sentences. HEENT: NCAT.  Cardiovascular: RRR. +S1, S2. No m/r/g. Respiratory: Diffuse wheezing and rales noted throughout all lung fields. Abdomen: obese. Mild epigastric tenderness.  No organomegaly, guarding, or rebound. Extremities: LE tender.  Adipose tissue noted but no LE edema.  Skin: warm, dry, intact. Neuro: No focal deficits.  Labs and Imaging: CBC BMET   Recent Labs Lab 03/27/13 1609  WBC 9.5  HGB 14.4  HCT 46.9   No results found for this basename: NA, K, CL, CO2, BUN, CREATININE, GLUCOSE, CALCIUM,  in the last 168 hours   Dg Chest 2 View 03/30/2013   CLINICAL DATA:  Cough  EXAM: CHEST  2 VIEW  COMPARISON:  03/27/2013  FINDINGS: Bronchitic changes. No focal consolidation. No pleural effusion or pneumothorax.  The heart is top-normal in size.  Degenerative changes of the visualized thoracolumbar spine.  IMPRESSION: Bronchitic changes.  No evidence of acute cardiopulmonary disease.  Dg Chest 2 View 03/27/2013   CLINICAL DATA:  Cough, wheezing  EXAM: CHEST  2 VIEW   COMPARISON:  None.  FINDINGS: The cardiac silhouette is within normal limits. There is prominence of the interstitial markings and mild peribronchial cuffing. No focal region of consolidation or focal infiltrates. The osseous structures are unremarkable.  IMPRESSION: Interstitial infiltrate raise concern of etiology such as bronchitis. No focal regions of consolidation or focal infiltrates.    Tommie Sams, DO 03/30/2013, 5:23 PM PGY-2, [facility] FPTS Intern pager: [phone], text pages welcome

## 2013-03-30 NOTE — Telephone Encounter (Signed)
Opened in error

## 2013-03-30 NOTE — Patient Instructions (Signed)
Go by the emergency room and let them know you are a direct admit to [address] they will page Dr. Gayla Doss at [phone]

## 2013-03-30 NOTE — Progress Notes (Addendum)
Subjective:    Patient ID: Kathy Lucas, female    DOB: [birth year], 67 y.o.   MRN: [id number] This chart was scribed for Kathy Chris, MD by Clydene Laming, ED Scribe. This patient was seen in room 2 and the patient's care was started at 2:34 PM. HPI HPI Comments: Kathy Lucas is a 67 y.o. female who presents to the [facility] complaining of severe upper abdominal pain with associated nausea, cough, and diarrhea. Pt states the pain began after beginning her new prescription for a respiratory illness onset two weeks ago. Pt states she is having worst symptoms than the last visit. Pt "slept off" the abdominal pain the first night. The second night the pain became intolerable. Pt has eaten 8 crackers in the last 36 hours. Pt is not having any urinary symptoms. She has been missing many days of work recently (6/10). Her daughter was recently hospitalized for pneumonia.    Patient Active Problem List   Diagnosis Date Noted  . BMI 45.0-49.9, adult 03/10/2013  . HTN (hypertension) 03/03/2012  . Arthritis 03/03/2012  . Anxiety 03/03/2012  . 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 Allergy   . Arthritis   . Hypertension   . Depression    Past Surgical History  Procedure Laterality Date  . Cesarean section    . Abdominal hysterectomy     Allergies  Allergen Reactions  . Sulfa Antibiotics    Prior to Admission medications   Medication Sig Start Date End Date Taking? Authorizing Provider  acetaminophen (TYLENOL) 500 MG tablet Take 500 mg by mouth every 6 (six) hours as needed.   Yes Historical Provider, MD  albuterol (PROVENTIL HFA;VENTOLIN HFA) 108 (90 BASE) MCG/ACT inhaler Inhale 2 puffs into the lungs every 4 (four) hours as needed for wheezing or shortness of breath (cough, shortness of breath or wheezing.). 03/27/13  Yes Collene Gobble, MD  ALPRAZolam Prudy Feeler) 0.25 MG tablet Take 0.25 mg by mouth at bedtime as needed.   Yes  Historical Provider, MD  atenolol (TENORMIN) 50 MG tablet Take 50 mg by mouth daily.   Yes Historical Provider, MD  benzonatate (TESSALON) 100 MG capsule Take 1-2 capsules (100-200 mg total) by mouth 3 (three) times daily as needed for cough. 03/27/13  Yes Collene Gobble, MD  meclizine (ANTIVERT) 12.5 MG tablet Take 12.5 mg by mouth 3 (three) times daily as needed.   Yes Historical Provider, MD  ciprofloxacin (CIPRO) 250 MG tablet Take 1 tablet (250 mg total) by mouth 2 (two) times daily. 03/10/13   Tonye Pearson, MD  levofloxacin (LEVAQUIN) 500 MG tablet Take 1 tablet (500 mg total) by mouth daily. 03/27/13 04/06/13  Collene Gobble, MD  meloxicam (MOBIC) 15 MG tablet Take 1 tablet (15 mg total) by mouth daily. 03/10/13   Tonye Pearson, MD  torsemide (DEMADEX) 10 MG tablet Take 10 mg by mouth daily.    Historical Provider, MD   History   Social History  . Marital Status: Single    Spouse Name: n/a (Divorced)    Number of Children: 2  . Years of Education: 12+   Occupational History  . Property Manager    Social History Main Topics  . Smoking status: Former Games developer  . Smokeless tobacco: Never Used  . Alcohol Use: No  . Drug Use: No  . Sexual Activity: No   Other Topics Concern  . Not on file  Social History Narrative   Lives alone.  Daughter, Kathy Lucas, lives nearby.    Review of Systems     Objective:   Physical Exam  Nursing note and vitals reviewed. Constitutional: She is oriented to person, place, and time.  Ill appearing but not toxic  HENT:  Head: Normocephalic and atraumatic.  Right Ear: External ear normal.  Left Ear: External ear normal.  Nose: Nose normal.  Mouth/Throat: Oropharynx is clear and moist.  Eyes: Conjunctivae and EOM are normal. Pupils are equal, round, and reactive to light.  Neck: Normal range of motion. Neck supple.  Cardiovascular: Normal rate, regular rhythm, normal heart sounds and intact distal pulses.   Pulmonary/Chest: Effort normal.  She has wheezes (bilateraly lung fields to mid lung). She has rales.  Abdominal: Soft. Bowel sounds are normal. There is tenderness (mid epigastrium).  Musculoskeletal: Normal range of motion.  Tenderness in bilateral lower calves without edema  Neurological: She is alert and oriented to person, place, and time. She has normal reflexes.  Skin: Skin is warm and dry.  Psychiatric: She has a normal mood and affect. Her behavior is normal. Thought content normal.   Filed Vitals:   03/30/13 1400  BP: 162/72  Pulse: 72  Temp: 99.5 F (37.5 C)  TempSrc: Oral  Resp: 18  Weight: 229 lb (103.874 kg)  SpO2: 92%   [facility] reading (PRIMARY) by  Dr. Cleta Alberts prominent interstitial markings bilaterally. No change from previous.        Assessment & Plan:  2:41 PM- Discussed treatment plan with pt at bedside. Pt verbalized understanding and agreement with I personally performed the services described in this documentation, which was scribed in my presence. The recorded information has been reviewed and is accurate. Will be a direct admit. I am not sure whether her GI symptoms were secondary to the Levaquin are secondary to the illness. She is unable to eat at the present time. She definitely would benefit from IV fluids to 2 and a tall of nebulizer treatments. She did not tolerate her albuterol inhaler she was previously prescribed. Pulse ox when she arrived was 92. IV will be attempted on the ambulance. She was given one nebulizer treatment prior to transfer of note she also recently was treated for urinary tract infection with 5 days of Cipro so there is still a question of C. difficile as etiology of her diarrhea.

## 2013-03-30 NOTE — Progress Notes (Signed)
Patient and daughter was highly agitated that MD placed heart diet after they were told that he would put in a regular diet. Patient on regular diet at this time.

## 2013-03-31 ENCOUNTER — Encounter (HOSPITAL_COMMUNITY): Payer: Self-pay | Admitting: Orthopedic Surgery

## 2013-03-31 DIAGNOSIS — R1013 Epigastric pain: Secondary | ICD-10-CM

## 2013-03-31 DIAGNOSIS — R197 Diarrhea, unspecified: Secondary | ICD-10-CM

## 2013-03-31 LAB — CBC
HCT: 43.1 % (ref 36.0–46.0)
MCHC: 35.3 g/dL (ref 30.0–36.0)
MCV: 88.9 fL (ref 78.0–100.0)
Platelets: 395 10*3/uL (ref 150–400)
RBC: 4.85 MIL/uL (ref 3.87–5.11)
WBC: 14 10*3/uL — ABNORMAL HIGH (ref 4.0–10.5)

## 2013-03-31 LAB — BASIC METABOLIC PANEL
BUN: 19 mg/dL (ref 6–23)
CO2: 21 mEq/L (ref 19–32)
Calcium: 8.3 mg/dL — ABNORMAL LOW (ref 8.4–10.5)
Chloride: 102 mEq/L (ref 96–112)
Chloride: 104 mEq/L (ref 96–112)
GFR calc Af Amer: >90 mL/min (ref >90)
GFR calc non Af Amer: 72 mL/min — ABNORMAL LOW (ref >90)
Potassium: 4.4 mEq/L (ref 3.5–5.1)
Sodium: 134 mEq/L — ABNORMAL LOW (ref 135–145)
Sodium: 134 mEq/L — ABNORMAL LOW (ref 135–145)

## 2013-03-31 LAB — TROPONIN I: Troponin I: <0.3 ng/mL (ref <0.30)

## 2013-03-31 MED ORDER — ALPRAZOLAM 0.25 MG PO TABS
0.2500 mg | ORAL_TABLET | Freq: Once | ORAL | Status: AC
  Administered 2013-03-31: 0.25 mg via ORAL
  Filled 2013-03-31: qty 1

## 2013-03-31 MED ORDER — LOPERAMIDE HCL 2 MG PO CAPS
2.0000 mg | ORAL_CAPSULE | ORAL | Status: DC | PRN
  Administered 2013-04-01: 4 mg via ORAL
  Filled 2013-03-31 (×2): qty 2

## 2013-03-31 NOTE — Progress Notes (Signed)
FMTS Attending Note  I personally saw and evaluated the patient. The plan of care was discussed with the resident team. I agree with the assessment and plan as documented by the resident.   Immanuel Fedak MD 

## 2013-03-31 NOTE — H&P (Signed)
FMTS Attending Note  I personally saw and evaluated the patient. The plan of care was discussed with the resident team. I agree with the assessment and plan as documented by the resident.   67 y/o female with PMH HTN, anxiety, previous tobacco abuse presented with dyspnea and cough, please see resident dictation for HPI, her symptoms are improved this AM with breathing treatments, steroids, and Azithromycin, she reports multiple loose BM's since yesterday, her abdominal pain is improved, she has had recent antibiotics including Cipro and Levaquin.   Vitals: reviewed Gen: pleasant female, NAD, speaking in full sentences HEENT: PERRL, EOMI, MMM, neck supple, no adenopathy Cardiac: RRR, S1 and S2 present, no murmurs, no heaves/thrills, no JVD Resp: diffuse wheezing, normal effort Abd: soft, mild epigastric tenderness, normal bowel sounds Ext: no edema, 2+radial and DP pulses Skin: no rash  1. Dyspnea - suspect COPD exacerbation as she has previous 1-2 PPD smoking history for over 30 years, quit 18 years ago, continue treatment as COPD exacerbation with steroids, Azithromycin, and breathing treatments, will need outpatient PFT's, no signs/symptoms of CHF, BNP pending, no plan for Echo at this time 2. Abdominal pain and diarrhea - ?C-dif, stool studies pending, cardiac workup negative 3. Hypertension: controlled with home Atenolol  If able to maintain SpO2 greater than 92% on room air would consider discharge later today.   Donnella Sham MD

## 2013-03-31 NOTE — Progress Notes (Signed)
Family Medicine Teaching Service Daily Progress Note Intern Pager: [phone]  Patient name: IBETH FAHMY Medical record number: [id number] Date of birth: 08-Oct-1945 Age: 67 y.o. Gender: female  Primary Care Provider: [facility] Consultants: None Code Status: Full Code  Pt Overview and Major Events to Date:  11/29 - Admission 11/29 - Continued diarrhea  Assessment and Plan: BETHAN ADAMEK is a 67 y.o. female who presents as a direct admission for [facility] with increasing cough, nausea and recent severe abdominal pain. PMH is significant for HTN and Anxiety.  # Productive Cough and SOB secondary to COPD exacerabation - Patient improving with Duonebs.  Currently off supplemental O2.  - Will continue current treatment: Prednisone 50 mg, Duonebs Q6/Albuterol Q2 PRN, Azithromycin - Mucinex BID PRN - Patient will need Spiriva on D/C and close follow up with PCP  # Abdominal pain - Unclear etiology, but likely from GI upset secondary to Abx. - Currently, abdominal pain is mild and reported as "cramping" from diarrhea - Lipase normal - CMP unremarkable. - will continue to monitor.  # Diarrhea - Awaiting C diff PCR - Stool culture also ordered.   # HTN - Continuing home Atenolol - BP stable and at goal this am (140/70)  # Anxiety - Continuing home PRN Xanax  FEN/GI: Heart Healthy Diet; NS @ 75 mL/hr Prophylaxis: Heparin SQ  Disposition: Pending clinical improvement.  Subjective:  Patient reports improvement in cough and SOB. She does report, however, worsening diarrhea overnight.  Objective: Temp:  [97.7 F (36.5 C)-99.5 F (37.5 C)] 99.4 F (37.4 C) (11/30 0700) Pulse Rate:  [72-84] 84 (11/30 0700) Resp:  [18] 18 (11/30 0700) BP: (140-164)/(70-90) 140/70 mmHg (11/30 0700) SpO2:  [92 %-99 %] 96 % (11/30 0902) Weight:  [229 lb (103.874 kg)] 229 lb (103.874 kg) (11/29 1730)  Physical Exam: General: well developed, well nourished. NAD distress this am. Cardiovascular: RRR. +S1,  S2. No m/r/g.  Respiratory: Diffuse wheezing throughout.  Abdomen: obese. Mild epigastric tenderness. No organomegaly, guarding, or rebound.  Extremities: No LE edema.   Laboratory:  Recent Labs Lab 03/27/13 1609 03/30/13 1638 03/31/13 0040  WBC 9.5 12.2* 14.0*  HGB 14.4 14.7 15.2*  HCT 46.9 42.7 43.1  PLT  --  357 395    Recent Labs Lab 03/30/13 1638 03/31/13 0040  NA 136 134*  K 4.5 4.1  CL 102 102  CO2 26 21  BUN 18 19  CREATININE 0.82 0.82  CALCIUM 9.3 8.8  PROT 7.1  --   BILITOT 0.4  --   ALKPHOS 64  --   ALT 13  --   AST 16  --   GLUCOSE 129* 131*   Cardiac Panel (last 3 results)  Recent Labs  03/30/13 1810 03/31/13 0040  TROPONINI <0.30 <0.30   Imaging/Diagnostic Tests: Dg Chest 2 View  03/30/2013 CLINICAL DATA: Cough EXAM: CHEST 2 VIEW COMPARISON: 03/27/2013 FINDINGS: Bronchitic changes. No focal consolidation. No pleural effusion or pneumothorax. The heart is top-normal in size. Degenerative changes of the visualized thoracolumbar spine. IMPRESSION: Bronchitic changes. No evidence of acute cardiopulmonary disease.   Dg Chest 2 View  03/27/2013 CLINICAL DATA: Cough, wheezing EXAM: CHEST 2 VIEW COMPARISON: None. FINDINGS: The cardiac silhouette is within normal limits. There is prominence of the interstitial markings and mild peribronchial cuffing. No focal region of consolidation or focal infiltrates. The osseous structures are unremarkable. IMPRESSION: Interstitial infiltrate raise concern of etiology such as bronchitis. No focal regions of consolidation or focal infiltrates.   Verdis Frederickson  Adriana Simas, DO 03/31/2013, 9:41 AM PGY-2 [facility] Family Medicine FPTS Intern pager: [phone], text pages welcome

## 2013-03-31 NOTE — Progress Notes (Addendum)
Pt has continued to have frequent diarrhea stools with abdominal cramping. Stool is yellow, malodorous and watery. Pt has been incontinent at times because diarrhea is sudden and she is unable to make it to the commode in time. She is very upset about the diarrhea and her hospitalization. Pt very frustrated because she did not sleep much last night.

## 2013-03-31 NOTE — Progress Notes (Signed)
Family Medicine Teaching Service BRIEF Progress Note Intern Pager: [phone]  Patient name: Kathy Lucas Medical record number: [id number] Date of birth: Dec 04, 1945 Age: 67 y.o. Gender: female  Primary Care Provider: [facility] Consultants: None Code Status: Full Code  Pt Overview and Major Events to Date:  11/29 - Admission 11/29 - Continued diarrhea & abdominal pain  I visited with patient.  She was resting comfortably, eating dinner, she is in no respiratory distress, breath sounds are diffusely wheezing with coarse crackles and poor air movement.  She continues to have significant abdominal pain and diarrhea.  She has had 2 bouts of nonbloody diarrhea.  C. difficile is negative.  She's also complaining of significant abdominal cramping and pain.  Her breathing status has improved but she notices she has been continuously wheezing throughout the day.  She has been able to ambulate today without significant dyspnea but is more labored than normal.  She is concerned if she goes home with her cramping abdominal pain she will need to return to the emergency department.  Diarrhea and abdominal pain is likely secondary to antibiotic associated diarrhea from Levaquin.  She reports she has had this before.  Will plan to treat with Imodium as an anti-spasmodic, check ambulatory pulse oximetry, continue COPDi exacerbation treatment and plan to discharge in the morning due to high likelihood of bounce back with discharge tonight.    Andrena Mews, DO 03/31/2013, 5:08 PM PGY-3 [facility] FPTS Intern pager: [phone], text pages welcome

## 2013-04-01 MED ORDER — TIOTROPIUM BROMIDE MONOHYDRATE 18 MCG IN CAPS: 18.0000 ug | ORAL_CAPSULE | Freq: Every day | RESPIRATORY_TRACT | Status: DC

## 2013-04-01 MED ORDER — LOPERAMIDE HCL 2 MG PO CAPS: 2.0000 mg | ORAL_CAPSULE | ORAL | Status: DC | PRN

## 2013-04-01 MED ORDER — ALBUTEROL SULFATE (5 MG/ML) 0.5% IN NEBU: 5.0000 mg | INHALATION_SOLUTION | RESPIRATORY_TRACT | Status: DC | PRN

## 2013-04-01 MED ORDER — PREDNISONE 50 MG PO TABS: 50.0000 mg | ORAL_TABLET | Freq: Every day | ORAL | Status: DC

## 2013-04-01 MED ORDER — AZITHROMYCIN 500 MG PO TABS: 500.0000 mg | ORAL_TABLET | Freq: Every day | ORAL | Status: DC

## 2013-04-01 MED ORDER — IPRATROPIUM BROMIDE 0.02 % IN SOLN: 0.5000 mg | Freq: Four times a day (QID) | RESPIRATORY_TRACT | Status: DC | PRN

## 2013-04-01 MED ORDER — AZITHROMYCIN 500 MG PO TABS
500.0000 mg | ORAL_TABLET | Freq: Every day | ORAL | Status: DC
  Administered 2013-04-01: 500 mg via ORAL
  Filled 2013-04-01: qty 1

## 2013-04-01 MED ORDER — DOXYCYCLINE HYCLATE 100 MG PO TABS: 100.0000 mg | ORAL_TABLET | Freq: Two times a day (BID) | ORAL | Status: DC

## 2013-04-01 NOTE — Discharge Summary (Signed)
Family Medicine Teaching [facility] Discharge Summary  Patient name: Kathy Lucas Medical record number: [id number] Date of birth: 12/31/1945 Age: 67 y.o. Gender: female Date of Admission: 03/30/2013  Date of Discharge: 04/01/2013  Admitting Physician: Uvaldo Rising, MD  Primary Care Provider: Lucilla Edin, MD Consultants: none  Indication for Hospitalization: Dyspnea and diarrhea  Discharge Diagnoses/Problem List:  Patient Active Problem List   Diagnosis Date Noted  . COPD exacerbation 03/30/2013  . BMI 45.0-49.9, adult 03/10/2013  . HTN (hypertension) 03/03/2012  . Arthritis 03/03/2012  . Anxiety 03/03/2012  . 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 NAD distress this am.  Cardiovascular: RRR. +S1, S2. No m/r/g.  Respiratory: Scattered wheezes but mostly clear throughout.  Abdomen: obese. Mild epigastric tenderness. No organomegaly, guarding, or rebound.  Extremities: No LE edema.  Brief Hospital Course: Kathy Lucas is a 67 y.o. female who presents as a direct admission for [facility] with increasing cough, nausea and recent severe abdominal pain. PMH is significant for HTN and Anxiety.   # Productive Cough and SOB secondary to COPD exacerabation: Patient was treated with duonebs, prednisone and azithromycin x1. She was weaned off oxygen by the second day. She was discharged with a 5 day course of doxycycline and 3 more days of prednisone. He was also started on spiriva maintenance therapy at the time of discharge.   # Abdominal pain, diarrhea: Patient tested negative for cdif and a stool culture was pending at time of discharge. Her CMP and lipase were normal. This was presumed to be due to levaquin use. She improved during hospitalization and was managed symptomatically with imodium.   # HTN  - Continuing home Atenolol  - BP stable and at goal this am (140/70)  #  Anxiety  - Continuing home PRN Xanax   Issues for Follow Up:  # Stool culture results, changes to management related to this  # Improvement in/resolution of diarrhea  # Respiratory status: need for further steroids and/or abx  Significant Procedures: none  Significant Labs and Imaging:   Recent Labs Lab 03/27/13 1609 03/30/13 1638 03/31/13 0040  WBC 9.5 12.2* 14.0*  HGB 14.4 14.7 15.2*  HCT 46.9 42.7 43.1  PLT  --  357 395    Recent Labs Lab 03/30/13 1638 03/31/13 0040 03/31/13 1244  NA 136 134* 134*  K 4.5 4.1 4.4  CL 102 102 104  CO2 26 21 21   GLUCOSE 129* 131* 138*  BUN 18 19 19   CREATININE 0.82 0.82 0.76  CALCIUM 9.3 8.8 8.3*  ALKPHOS 64  --   --   AST 16  --   --   ALT 13  --   --   ALBUMIN 3.2*  --   --    TROPONINI  <0.30  <0.30    Cdif negative  Stool culture reincubated for better growth   Dg Chest 2 View  Bronchitic changes. No focal consolidation. No pleural effusion or pneumothorax. The heart is top-normal in size. Degenerative changes of the visualized thoracolumbar spine.   Dg Chest 2 View  The cardiac silhouette is within normal limits. There is prominence of the interstitial markings and mild peribronchial cuffing. No focal region of consolidation or focal infiltrates. The osseous structures are unremarkable.  Results/Tests Pending at Time of Discharge: Stool culture  Discharge Medications:    Medication List  albuterol (5 MG/ML) 0.5% nebulizer solution  Commonly known as:  PROVENTIL  Take 1 mL (5 mg total) by nebulization every 4 (four) hours as needed for wheezing or shortness of breath.     ALPRAZolam 0.25 MG tablet  Commonly known as:  XANAX  Take 0.25 mg by mouth at bedtime.     atenolol 50 MG tablet  Commonly known as:  TENORMIN  Take 50 mg by mouth every morning.     benzonatate 100 MG capsule  Commonly known as:  TESSALON  Take 1-2 capsules (100-200 mg total) by mouth 3 (three) times daily as needed for cough.      doxycycline 100 MG tablet  Commonly known as:  VIBRA-TABS  Take 1 tablet (100 mg total) by mouth 2 (two) times daily.     ipratropium 0.02 % nebulizer solution  Commonly known as:  ATROVENT  Take 2.5 mLs (0.5 mg total) by nebulization every 6 (six) hours as needed for wheezing or shortness of breath.     loperamide 2 MG capsule  Commonly known as:  IMODIUM  Take 1-2 capsules (2-4 mg total) by mouth as needed for diarrhea or loose stools (4mg  X 1 now then 2 mg prn).     meclizine 12.5 MG tablet  Commonly known as:  ANTIVERT  Take 12.5 mg by mouth 3 (three) times daily as needed for dizziness or nausea.     predniSONE 50 MG tablet  Commonly known as:  DELTASONE  Take 1 tablet (50 mg total) by mouth daily with breakfast.     tiotropium 18 MCG inhalation capsule  Commonly known as:  SPIRIVA HANDIHALER  Place 1 capsule (18 mcg total) into inhaler and inhale daily.     torsemide 10 MG tablet  Commonly known as:  DEMADEX  Take 10 mg by mouth every morning.       Discharge Instructions: Please refer to Patient Instructions section of EMR for full details.  Patient was counseled important signs and symptoms that should prompt return to medical care, changes in medications, dietary instructions, activity restrictions, and follow up appointments.   Follow-Up Appointments:     Follow-up Information   Schedule an appointment as soon as possible for a visit with DAUB, STEVE A, MD. (Please see him by the end of the week)    Specialty:  Family Medicine   Contact information:   [address] [place] [address] [phone]      Beverely Low, MD 04/01/2013, 2:11 PM PGY-1, [facility] Family [facility]

## 2013-04-01 NOTE — Progress Notes (Signed)
Rn reviewed discharge instructions with patient and daughter. All quesstions answered. Patient discharged with volunteer services to private vehicle.

## 2013-04-01 NOTE — Progress Notes (Signed)
Family Medicine Teaching Service Daily Progress Note Intern Pager: [phone]  Patient name: Kathy Lucas Medical record number: [id number] Date of birth: 04/18/1946 Age: 67 y.o. Gender: female  Primary Care Provider: [facility] Consultants: None Code Status: Full Code  Pt Overview and Major Events to Date:  11/29 - Admission with diarrhea and dyspnea  Assessment and Plan: Kathy Lucas is a 67 y.o. female who presents as a direct admission for [facility] with increasing cough, nausea and recent severe abdominal pain. PMH is significant for HTN and Anxiety.  # Productive Cough and SOB secondary to COPD exacerabation - Patient improving with Duonebs.  Currently off supplemental O2.  - Will continue current treatment: Prednisone 50 mg, Duonebs Q6/Albuterol Q2 PRN - Mucinex BID PRN - Home with spiriva and doxycycline  # Abdominal pain, diarrhea - Unclear etiology, but likely secondary to Abx. - Currently, abdominal pain is mild and reported as "cramping" from diarrhea - Lipase normal - CMP unremarkable. - Cdif neg - stool culture pending - will continue to monitor. - Imodium prn given cdif negative and presumed non-infectious etiology  # HTN - Continuing home Atenolol - BP stable and at goal this am (140/70)  # Anxiety - Continuing home PRN Xanax  FEN/GI: Heart Healthy Diet; NS @ 75 mL/hr Prophylaxis: Heparin SQ  Disposition: Home today to follow up with Dr. Cleta Alberts later this week.  Subjective:  Patient reports improvement in cough and SOB. She does report diarrhea with incontinence early this morning and is very upset that she did not receive her prn imodium that was ordered last night. She feels like she is ready to go home.  Objective: Temp:  [98.3 F (36.8 C)-98.8 F (37.1 C)] 98.3 F (36.8 C) (12/01 0639) Pulse Rate:  [85-89] 88 (12/01 0946) Resp:  [18] 18 (12/01 0639) BP: (119-172)/(47-71) 126/47 mmHg (12/01 0639) SpO2:  [95 %-96 %] 96 % (12/01 1002)  Physical  Exam: General: well developed, well nourished. NAD distress this am. Cardiovascular: RRR. +S1, S2. No m/r/g.  Respiratory: Scattered wheezes but mostly clear throughout.  Abdomen: obese. Mild epigastric tenderness. No organomegaly, guarding, or rebound.  Extremities: No LE edema.   Laboratory:  Recent Labs Lab 03/27/13 1609 03/30/13 1638 03/31/13 0040  WBC 9.5 12.2* 14.0*  HGB 14.4 14.7 15.2*  HCT 46.9 42.7 43.1  PLT  --  357 395    Recent Labs Lab 03/30/13 1638 03/31/13 0040 03/31/13 1244  NA 136 134* 134*  K 4.5 4.1 4.4  CL 102 102 104  CO2 26 21 21   BUN 18 19 19   CREATININE 0.82 0.82 0.76  CALCIUM 9.3 8.8 8.3*  PROT 7.1  --   --   BILITOT 0.4  --   --   ALKPHOS 64  --   --   ALT 13  --   --   AST 16  --   --   GLUCOSE 129* 131* 138*   Cardiac Panel (last 3 results)  Recent Labs  03/30/13 1810 03/31/13 0040  TROPONINI <0.30 <0.30   Cdif negative Stool culture reincubated for better growth  Imaging/Diagnostic Tests: Dg Chest 2 View  Bronchitic changes. No focal consolidation. No pleural effusion or pneumothorax. The heart is top-normal in size. Degenerative changes of the visualized thoracolumbar spine.   Dg Chest 2 View  The cardiac silhouette is within normal limits. There is prominence of the interstitial markings and mild peribronchial cuffing. No focal region of consolidation or focal infiltrates. The osseous structures are unremarkable.  Beverely Low, MD 04/01/2013, 2:10 PM PGY-1 [facility] Family [facility] FPTS Intern pager: [phone], text pages welcome

## 2013-04-01 NOTE — Progress Notes (Signed)
Patient called back to floor at 1600 to inform RN that she cannot get her prescriptions from [facility] because they do not have all of those medications. Patient very upset when speaking with RN and wanted me to call the MD to inform them of her difficulty with her prescriptions.   MD called RN at [phone] to help get this resolved.

## 2013-04-01 NOTE — Progress Notes (Signed)
Called prescriptions in to [facility] on [address].

## 2013-04-02 NOTE — Progress Notes (Signed)
FMTS Attending Daily Note:  Renold Don MD  [phone] pager  Family Practice pager:  [phone]  I have discussed this patient with the resident. I agree with the resident's findings, assessment and care plan.  Additionally:  Discharged before I could examine patient.    Tobey Grim, MD 04/02/2013 6:23 PM

## 2013-04-03 LAB — STOOL CULTURE

## 2013-04-04 NOTE — Discharge Summary (Signed)
Family Medicine Teaching Service  Discharge Note : Attending Jeff Quinnley Colasurdo MD Pager [phone] Inpatient Team Pager:  [phone]  I have reviewed this patient and the patient's chart and have discussed discharge planning with the resident at the time of discharge. I agree with the discharge plan as above.    

## 2013-04-05 ENCOUNTER — Ambulatory Visit (INDEPENDENT_AMBULATORY_CARE_PROVIDER_SITE_OTHER): Payer: Medicare Other | Admitting: Emergency Medicine

## 2013-04-05 VITALS — BP 140/90 | HR 67 | Temp 98.4°F | Resp 18 | Wt 229.0 lb

## 2013-04-05 DIAGNOSIS — J209 Acute bronchitis, unspecified: Secondary | ICD-10-CM

## 2013-04-05 NOTE — Patient Instructions (Signed)
Recheck one week. We will repeat a chest x-ray at that time.

## 2013-04-05 NOTE — Progress Notes (Addendum)
Subjective:    Patient ID: Kathy Lucas, female    DOB: 10-Apr-1946, 67 y.o.   MRN: [id number]  This chart was scribed for Collene Gobble, MD by Dorothey Baseman, ED Scribe.   Chief Complaint  Patient presents with   Follow-up    re-check w/ Dr. Cleta Alberts, regarding hospital admission for pneumonia    HPI Kathy Lucas is a 67 y.o. female with a history of COPD who presents to [facility] for follow up for pneumonia. Patient was admitted to the hospital for this and was discharged 2 days ago with nebulizer treatments, prednisone, and antibiotics. Patient reports using the nebulizer treatment nightly with relief, has a single dose of prednisone left, and has 2 days of the antibiotic left to finish. She states that she currently feels much better. Patient also has a history of HTN.   Past Medical History  Diagnosis Date   Allergy    Arthritis    Hypertension    Depression    COPD (chronic obstructive pulmonary disease)    Current Outpatient Prescriptions on File Prior to Visit  Medication Sig Dispense Refill   albuterol (PROVENTIL) (5 MG/ML) 0.5% nebulizer solution Take 1 mL (5 mg total) by nebulization every 4 (four) hours as needed for wheezing or shortness of breath.  20 mL  0   ALPRAZolam (XANAX) 0.25 MG tablet Take 0.25 mg by mouth at bedtime.       atenolol (TENORMIN) 50 MG tablet Take 50 mg by mouth every morning.       doxycycline (VIBRA-TABS) 100 MG tablet Take 1 tablet (100 mg total) by mouth 2 (two) times daily.  10 tablet  0   ipratropium (ATROVENT) 0.02 % nebulizer solution Take 2.5 mLs (0.5 mg total) by nebulization every 6 (six) hours as needed for wheezing or shortness of breath.  75 mL  0   meclizine (ANTIVERT) 12.5 MG tablet Take 12.5 mg by mouth 3 (three) times daily as needed for dizziness or nausea.        predniSONE (DELTASONE) 50 MG tablet Take 1 tablet (50 mg total) by mouth daily with breakfast.  3 tablet  0   tiotropium (SPIRIVA  HANDIHALER) 18 MCG inhalation capsule Place 1 capsule (18 mcg total) into inhaler and inhale daily.  30 capsule  0   torsemide (DEMADEX) 10 MG tablet Take 10 mg by mouth every morning.        benzonatate (TESSALON) 100 MG capsule Take 1-2 capsules (100-200 mg total) by mouth 3 (three) times daily as needed for cough.  40 capsule  0   loperamide (IMODIUM) 2 MG capsule Take 1-2 capsules (2-4 mg total) by mouth as needed for diarrhea or loose stools (4mg  X 1 now then 2 mg prn).  30 capsule  0   No current facility-administered medications on file prior to visit.   Allergies  Allergen Reactions   Levaquin [Levofloxacin In D5w] Diarrhea   Sulfa Antibiotics    Review of Systems     Objective:   Physical Exam  CONSTITUTIONAL: Well developed/well nourished HEAD: Normocephalic/atraumatic EYES: EOMI/PERRL ENMT: Mucous membranes moist, oropharynx clear and moist  NECK: supple no meningeal signs, no anterior cervical lymphadenopathy SPINE:entire spine nontender CV: S1/S2 noted, no murmurs/rubs/gallops noted, normal rate and rhythm  LUNGS: Lungs are clear to auscultation bilaterally, no apparent distress, breath sounds are symmetrical, dry rhonchi in both lung fields without wheezes ABDOMEN: soft, nontender, no rebound or guarding GU:no cva tenderness NEURO:  Pt is awake/alert, moves all extremitiesx4 EXTREMITIES: pulses normal, full ROM SKIN: warm, color normal PSYCH: no abnormalities of mood noted  BP 140/90   Pulse 67   Temp(Src) 98.4 F (36.9 C) (Oral)   Resp 18   Wt 229 lb (103.874 kg)   SpO2 97%     Assessment & Plan:  1:49 PM- Advised patient to stay out of work for about another week. Advised patient to follow up next week and discussed that a recheck chest x-ray will be performed then. Discussed treatment plan with patient at bedside and patient verbalized agreement.  She is feeling better since her discharge. She is afebrile her pulse ox is 97. She was treated with prednisone  and antibiotics. She's currently on an albuterol inhaler along with her antibiotics. Will recheck in 2-3 weeks and do pulmonary function tests and further evaluation for COPD at that time. I personally took the history and performed the physical examination and the documentation was provided by my scribe.

## 2013-04-12 ENCOUNTER — Telehealth: Payer: Self-pay

## 2013-04-12 NOTE — Telephone Encounter (Signed)
Patient is calling to ask if there is any thing stronger than preparation h for her hemorrhoids please call her at [phone]

## 2013-04-12 NOTE — Telephone Encounter (Signed)
Thanks. I have called to advise. Asked her to come in if this is not helpful.

## 2013-04-12 NOTE — Telephone Encounter (Signed)
Tucks pads and sitz baths are the best and make sure her stool is soft.

## 2013-04-13 ENCOUNTER — Ambulatory Visit (INDEPENDENT_AMBULATORY_CARE_PROVIDER_SITE_OTHER): Payer: Medicare Other | Admitting: Internal Medicine

## 2013-04-13 ENCOUNTER — Ambulatory Visit: Payer: Medicare Other

## 2013-04-13 VITALS — BP 150/86 | HR 70 | Temp 97.8°F | Resp 16 | Ht 59.0 in | Wt 235.8 lb

## 2013-04-13 DIAGNOSIS — R9389 Abnormal findings on diagnostic imaging of other specified body structures: Secondary | ICD-10-CM

## 2013-04-13 DIAGNOSIS — R918 Other nonspecific abnormal finding of lung field: Secondary | ICD-10-CM

## 2013-04-13 NOTE — Progress Notes (Addendum)
Subjective:  This chart was scribed for Kathy Sia, MD by Carl Best, Medical Scribe. This patient was seen in Room 10 and the patient's care was started at 4:47 PM.   Patient ID: Kathy Lucas, female    DOB: May 04, 1945, 67 y.o.   MRN: [id number]  HPI HPI Comments: Kathy Lucas is a 67 y.o. female who presents to the [facility] for a recheck after the patient was admitted to the hospital.  The patient states that the doctors at the ED thought that she had Bronchitis or Pneumonia but they diagnosed her with COPD.  She states that she did not have a chest x-ray performed in the hospital.  She states that she was supposed to see Dr. Cleta Alberts yesterday but she was sick with hemorrhoids.  The patient states that she used to smoke but she quit 18 years ago.   Symptoms that led to hospitalization have resolved and she is feeling much better without shortness of breath.  Past Medical History  Diagnosis Date  . Allergy   . Arthritis   . Hypertension   . Depression   . COPD (chronic obstructive pulmonary disease)    Past Surgical History  Procedure Laterality Date  . Cesarean section    . Abdominal hysterectomy     Family History  Problem Relation Age of Onset  . Cancer Sister 47    metastatic lung (+tobacco)  . Alcohol abuse Sister    History   Social History  . Marital Status: Single    Spouse Name: n/a (Divorced)    Number of Children: 2  . Years of Education: 12+   Occupational History  . Property Manager    Social History Main Topics  . Smoking status: Former Games developer  . Smokeless tobacco: Never Used  . Alcohol Use: No  . Drug Use: No  . Sexual Activity: No   Other Topics Concern  . Not on file   Social History Narrative   Lives alone.  Daughter, [name], lives nearby.   Allergies  Allergen Reactions  . Levaquin [Levofloxacin In D5w] Diarrhea  . Sulfa Antibiotics      Review of Systems  All other systems reviewed and are  negative.      Objective:  Physical Exam  Nursing note and vitals reviewed. Constitutional: She is oriented to person, place, and time. She appears well-developed and well-nourished. No distress.  HENT:  Head: Normocephalic and atraumatic.  Eyes: Conjunctivae and EOM are normal. Pupils are equal, round, and reactive to light.  Neck: Neck supple.  Cardiovascular: Normal rate.   Pulmonary/Chest: Effort normal. She has no wheezes. She has no rales.  Neurological: She is alert and oriented to person, place, and time. No cranial nerve deficit.  Psychiatric: She has a normal mood and affect. Her behavior is normal.      BP 150/86  Pulse 70  Temp(Src) 97.8 F (36.6 C) (Oral)  Resp 16  Ht 4\' 11"  (1.499 m)  Wt 235 lb 12.8 oz (106.958 kg)  BMI 47.60 kg/m2  SpO2 96% [facility] reading (PRIMARY) by  Dr. Mykeisha Dysert=NAD--less markings tan 03/2013    Assessment & Plan:   I personally performed the services described in this documentation, which was scribed in my presence. The recorded information has been reviewed and is accurate. I have completed the patient encounter in its entirety as documented by the scribe, with editing by me where necessary. Charlton Boule P. Merla Riches, M.D. Abnormal chest x-ray - Plan: DG  Chest 2 View  Her x-ray still favors a reactive process without underlying COPD, however pulmonary function studies may be needed to better establish her pulmonary capacity. As long as she remains asymptomatic and is active this can be postponed.

## 2013-08-14 ENCOUNTER — Ambulatory Visit (INDEPENDENT_AMBULATORY_CARE_PROVIDER_SITE_OTHER): Payer: Medicare Other | Admitting: Family Medicine

## 2013-08-14 VITALS — BP 156/70 | HR 63 | Temp 98.0°F | Resp 16 | Ht 59.5 in | Wt 242.0 lb

## 2013-08-14 DIAGNOSIS — N390 Urinary tract infection, site not specified: Secondary | ICD-10-CM

## 2013-08-14 DIAGNOSIS — R300 Dysuria: Secondary | ICD-10-CM

## 2013-08-14 DIAGNOSIS — J029 Acute pharyngitis, unspecified: Secondary | ICD-10-CM

## 2013-08-14 LAB — POCT URINALYSIS DIPSTICK
Bilirubin, UA: NEGATIVE
Blood, UA: NEGATIVE
Glucose, UA: NEGATIVE
NITRITE UA: NEGATIVE
PH UA: 5.5
PROTEIN UA: NEGATIVE
Spec Grav, UA: 1.02
UROBILINOGEN UA: 0.2

## 2013-08-14 LAB — POCT UA - MICROSCOPIC ONLY
CASTS, UR, LPF, POC: NEGATIVE
CRYSTALS, UR, HPF, POC: NEGATIVE
Mucus, UA: NEGATIVE
YEAST UA: NEGATIVE

## 2013-08-14 MED ORDER — MAGIC MOUTHWASH: 5.0000 mL | Freq: Three times a day (TID) | ORAL | Status: DC

## 2013-08-14 MED ORDER — NITROFURANTOIN MONOHYD MACRO 100 MG PO CAPS: 100.0000 mg | ORAL_CAPSULE | Freq: Two times a day (BID) | ORAL | Status: DC

## 2013-08-14 NOTE — Addendum Note (Signed)
Addended byAlden Benjamin: Arvil Utz R on: 08/14/2013 08:05 PM   Modules accepted: Orders

## 2013-08-14 NOTE — Progress Notes (Signed)
This chart was scribed for Kathy SidleKurt Alyssah Algeo, MD by Nicholos Johnsenise Iheanachor, Medical Scribe. This patient's care was started at 7:37 PM.  Patient ID: Kathy Lucas MRN: [id number], DOB: Jan 17, 1946, 68 y.o. Date of Encounter: 08/14/2013, 7:36 PM   Primary Physician: Lucilla EdinAUB, STEVE A, MD  Chief Complaint:  Chief Complaint  Patient presents with  . Sore Throat    x 2 week  . Urinary Frequency    dysuria x today    HPI: 68 y.o. year old female presents with a 3 days of mild dysuria. States the end of bladder emptying is where discomfort is most apparent. States she takes fluid pills regularly and after taking pill she is able to go to the bathroom more easily. No hematuria No sick contacts, recent antibiotics, or recent travels.   No vaginal discharge, back pain, fever  Also reports sore throat that worsens as the day progresses, onset several weeks ago. Has been taking Robitussin. Also had a cough that is resolving. Has felt feverish and achy at times but has not missed work. Works as a Investment banker, corporateproperty manager.   Past Medical History  Diagnosis Date  . Allergy   . Arthritis   . Hypertension   . Depression   . COPD (chronic obstructive pulmonary disease)      Home Meds: Prior to Admission medications   Medication Sig Start Date End Date Taking? Authorizing Provider  albuterol (PROVENTIL) (5 MG/ML) 0.5% nebulizer solution Take 1 mL (5 mg total) by nebulization every 4 (four) hours as needed for wheezing or shortness of breath. 04/01/13  Yes Beverely LowElena Adamo, MD  ALPRAZolam Prudy Feeler(XANAX) 0.25 MG tablet Take 0.25 mg by mouth at bedtime.   Yes Historical Provider, MD  atenolol (TENORMIN) 50 MG tablet Take 50 mg by mouth every morning.   Yes Historical Provider, MD  ipratropium (ATROVENT) 0.02 % nebulizer solution Take 2.5 mLs (0.5 mg total) by nebulization every 6 (six) hours as needed for wheezing or shortness of breath. 04/01/13  Yes Beverely LowElena Adamo, MD  loperamide (IMODIUM) 2 MG capsule Take 1-2 capsules (2-4 mg  total) by mouth as needed for diarrhea or loose stools (4mg  X 1 now then 2 mg prn). 04/01/13  Yes Beverely LowElena Adamo, MD  meclizine (ANTIVERT) 12.5 MG tablet Take 12.5 mg by mouth 3 (three) times daily as needed for dizziness or nausea.    Yes Historical Provider, MD  tiotropium (SPIRIVA HANDIHALER) 18 MCG inhalation capsule Place 1 capsule (18 mcg total) into inhaler and inhale daily. 04/01/13  Yes Beverely LowElena Adamo, MD  torsemide (DEMADEX) 10 MG tablet Take 10 mg by mouth every morning.    Yes Historical Provider, MD  benzonatate (TESSALON) 100 MG capsule Take 1-2 capsules (100-200 mg total) by mouth 3 (three) times daily as needed for cough. 03/27/13   Collene GobbleSteven A Daub, MD    Allergies:  Allergies  Allergen Reactions  . Levaquin [Levofloxacin In D5w] Diarrhea  . Sulfa Antibiotics     History   Social History  . Marital Status: Single    Spouse Name: n/a (Divorced)    Number of Children: 2  . Years of Education: 12+   Occupational History  . Property Manager    Social History Main Topics  . Smoking status: Former Games developermoker  . Smokeless tobacco: Never Used  . Alcohol Use: No  . Drug Use: No  . Sexual Activity: No   Other Topics Concern  . Not on file   Social History Narrative   Lives alone.  Daughter,  Traci, lives nearby.     Review of Systems: Constitutional: negative for chills, fever, night sweats or weight changes Head: Positive for sore throat, mild cough,  Cardiovascular: negative for chest pain or palpitations Respiratory: negative for hemoptysis, wheezing, or shortness of breath Abdominal: negative for abdominal pain, nausea, vomiting or diarrhea Dermatological: negative for rash Neurologic: negative for headache   Physical Exam: Blood pressure 156/70, pulse 63, temperature 98 F (36.7 C), temperature source Oral, resp. rate 16, height 4' 11.5" (1.511 m), weight 242 lb (109.77 kg), SpO2 98.00%., Body mass index is 48.08 kg/(m^2). General: Well developed, well nourished, in no  acute distress. Head: Normocephalic, atraumatic, eyes without discharge, sclera non-icteric, nares are congested. Bilateral auditory canals clear, TM's are without perforation, pearly grey with reflective cone of light bilaterally. Serous effusion bilaterally behind TM's. Maxillary sinus TTP. Oral cavity moist, dentition normal. Posterior pharynx with post nasal drip and mild erythema. No peritonsillar abscess or tonsillar exudate. Neck: Supple. No thyromegaly. Full ROM. No lymphadenopathy. Lungs: Lungs clear bilaterally to auscultation without wheezes, rales, or rhonchi. Breathing is unlabored. Heart: RRR with S1 S2. No murmurs, rubs, or gallops appreciated. Abdomen: Soft, non-tender, non-distended with normoactive bowel sounds. No hepatosplenomegaly. No rebound/guarding. No obvious abdominal masses. McBurney's, Rovsing's, Iliopsoas, and table jar all negative. Msk:  Strength and tone normal for age. Extremities: No clubbing or cyanosis. No edema. Neuro: Alert and oriented X 3. Moves all extremities spontaneously. CNII-XII grossly in tact. Psych:  Responds to questions appropriately with a normal affect.   Results for orders placed in visit on 08/14/13  POCT UA - MICROSCOPIC ONLY      Result Value Ref Range   WBC, Ur, HPF, POC 20-30     RBC, urine, microscopic 0-2     Bacteria, U Microscopic 1+     Mucus, UA neg     Epithelial cells, urine per micros 2-3     Crystals, Ur, HPF, POC neg     Casts, Ur, LPF, POC neg     Yeast, UA neg    POCT URINALYSIS DIPSTICK      Result Value Ref Range   Color, UA yellow     Clarity, UA clear     Glucose, UA neg     Bilirubin, UA neg     Ketones, UA trace     Spec Grav, UA 1.020     Blood, UA neg     pH, UA 5.5     Protein, UA neg     Urobilinogen, UA 0.2     Nitrite, UA neg     Leukocytes, UA small (1+)       ASSESSMENT AND PLAN:  68 y.o. year old female with Dysuria - Plan: POCT UA - Microscopic Only, POCT urinalysis dipstick,  nitrofurantoin, macrocrystal-monohydrate, (MACROBID) 100 MG capsule  Sore throat - Plan: Culture, Group A Strep, Alum & Mag Hydroxide-Simeth (MAGIC MOUTHWASH) SOLN   - -Mucinex -Tylenol/Motrin prn -Rest/fluids -RTC precautions -RTC 3-5 days if no improvement  Signed, Kathy SidleKurt Ladawna Walgren, MD 08/14/2013 7:36 PM

## 2013-08-14 NOTE — Patient Instructions (Signed)
Urinary Tract Infection  Urinary tract infections (UTIs) can develop anywhere along your urinary tract. Your urinary tract is your body's drainage system for removing wastes and extra water. Your urinary tract includes two kidneys, two ureters, a bladder, and a urethra. Your kidneys are a pair of bean-shaped organs. Each kidney is about the size of your fist. They are located below your ribs, one on each side of your spine.  CAUSES  Infections are caused by microbes, which are microscopic organisms, including fungi, viruses, and bacteria. These organisms are so small that they can only be seen through a microscope. Bacteria are the microbes that most commonly cause UTIs.  SYMPTOMS   Symptoms of UTIs may vary by age and gender of the patient and by the location of the infection. Symptoms in young women typically include a frequent and intense urge to urinate and a painful, burning feeling in the bladder or urethra during urination. Older women and men are more likely to be tired, shaky, and weak and have muscle aches and abdominal pain. A fever may mean the infection is in your kidneys. Other symptoms of a kidney infection include pain in your back or sides below the ribs, nausea, and vomiting.  DIAGNOSIS  To diagnose a UTI, your caregiver will ask you about your symptoms. Your caregiver also will ask to provide a urine sample. The urine sample will be tested for bacteria and white blood cells. White blood cells are made by your body to help fight infection.  TREATMENT   Typically, UTIs can be treated with medication. Because most UTIs are caused by a bacterial infection, they usually can be treated with the use of antibiotics. The choice of antibiotic and length of treatment depend on your symptoms and the type of bacteria causing your infection.  HOME CARE INSTRUCTIONS   If you were prescribed antibiotics, take them exactly as your caregiver instructs you. Finish the medication even if you feel better after you  have only taken some of the medication.   Drink enough water and fluids to keep your urine clear or pale yellow.   Avoid caffeine, tea, and carbonated beverages. They tend to irritate your bladder.   Empty your bladder often. Avoid holding urine for long periods of time.   Empty your bladder before and after sexual intercourse.   After a bowel movement, women should cleanse from front to back. Use each tissue only once.  SEEK MEDICAL CARE IF:    You have back pain.   You develop a fever.   Your symptoms do not begin to resolve within 3 days.  SEEK IMMEDIATE MEDICAL CARE IF:    You have severe back pain or lower abdominal pain.   You develop chills.   You have nausea or vomiting.   You have continued burning or discomfort with urination.  MAKE SURE YOU:    Understand these instructions.   Will watch your condition.   Will get help right away if you are not doing well or get worse.  Document Released: 01/26/2005 Document Revised: 10/18/2011 Document Reviewed: 05/27/2011  ExitCare Patient Information 2014 [employer].

## 2013-08-17 LAB — URINE CULTURE: Colony Count: >=100000

## 2013-08-18 LAB — CULTURE, GROUP A STREP

## 2013-09-02 ENCOUNTER — Ambulatory Visit (INDEPENDENT_AMBULATORY_CARE_PROVIDER_SITE_OTHER): Payer: Medicare Other | Admitting: Internal Medicine

## 2013-09-02 VITALS — BP 146/84 | HR 71 | Temp 98.0°F | Ht 59.0 in | Wt 239.0 lb

## 2013-09-02 DIAGNOSIS — N390 Urinary tract infection, site not specified: Secondary | ICD-10-CM

## 2013-09-02 DIAGNOSIS — R300 Dysuria: Secondary | ICD-10-CM

## 2013-09-02 LAB — POCT URINALYSIS DIPSTICK
BILIRUBIN UA: NEGATIVE
GLUCOSE UA: NEGATIVE
KETONES UA: NEGATIVE
Nitrite, UA: NEGATIVE
PH UA: 5.5
Protein, UA: NEGATIVE
RBC UA: NEGATIVE
SPEC GRAV UA: 1.015
Urobilinogen, UA: 0.2

## 2013-09-02 LAB — POCT UA - MICROSCOPIC ONLY
CRYSTALS, UR, HPF, POC: NEGATIVE
Casts, Ur, LPF, POC: NEGATIVE
MUCUS UA: NEGATIVE
Yeast, UA: NEGATIVE

## 2013-09-02 MED ORDER — NITROFURANTOIN MONOHYD MACRO 100 MG PO CAPS: 100.0000 mg | ORAL_CAPSULE | Freq: Two times a day (BID) | ORAL | Status: DC

## 2013-09-02 NOTE — Progress Notes (Signed)
Subjective:    Patient ID: Kathy Lucas, female    DOB: Apr 26, 1946, 68 y.o.   MRN: [id number] This chart was scribed for Ellamae Siaobert Kellan Raffield, MD by Evon Slackerrance Branch, ED Scribe. This Patient was seen in room 12 and the patients care was started at 8:14 PM  HPI HPI Comments: Kathy Lucas is a 68 y.o. female who presents to the [facility] complaining of kidney infection. She states she has associated symptoms of hematuria, dysuria, and frequent urination. She states was recently in in urgent care on 08/14/13 for similar symptoms with temporary relief but relapsed after medication completed. She states that's the medication she was prescribed relieved her symptoms temporally but she thinks she was not on the treatment plan long enough. Denies fever, back pain, and abdominal pain. Has a history of recurrent urinary tract problems. Has been evaluated by urology in 2 or 3 different locations in past years including 1 cystoscopy with dilation. Her problems improved when her uterus was removed.  Chief Complaint  Patient presents with  . ?kidney infection    blood in urine yesterday morning.  no pain in her back.  she just does not feel good.  denies any fever   Patient Active Problem List   Diagnosis Date Noted  . COPD exacerbation 03/30/2013  . BMI 45.0-49.9, adult 03/10/2013  . HTN (hypertension) 03/03/2012  . Arthritis 03/03/2012  . Anxiety 03/03/2012  . 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  Gastrointestinal: Negative for abdominal pain.  Genitourinary: Positive for dysuria, frequency and hematuria.  Musculoskeletal: Negative for back pain.    Objective:   Physical Exam  Nursing note and vitals reviewed. Constitutional: She is oriented to person, place, and time. She appears well-developed and well-nourished. No distress.  HENT:  Head: Normocephalic and atraumatic.  Eyes: EOM are normal.  Neck: Neck supple.    Cardiovascular: Normal rate.   Pulmonary/Chest: Effort normal. No respiratory distress.  Musculoskeletal: Normal range of motion.  Neurological: She is alert and oriented to person, place, and time.  Skin: Skin is warm and dry.  Psychiatric: She has a normal mood and affect. Her behavior is normal.   Results for orders placed in visit on 09/02/13  POCT URINALYSIS DIPSTICK      Result Value Ref Range   Color, UA yellow     Clarity, UA hazy     Glucose, UA neg     Bilirubin, UA neg     Ketones, UA neg     Spec Grav, UA 1.015     Blood, UA neg     pH, UA 5.5     Protein, UA neg     Urobilinogen, UA 0.2     Nitrite, UA neg     Leukocytes, UA Trace    POCT UA - MICROSCOPIC ONLY      Result Value Ref Range   WBC, Ur, HPF, POC 4-8     RBC, urine, microscopic 1-3     Bacteria, U Microscopic small     Mucus, UA neg     Epithelial cells, urine per micros 3-6     Crystals, Ur, HPF, POC neg     Casts, Ur, LPF, POC neg     Yeast, UA neg      Assessment & Plan:  Recurrent urinary tract infection  Unfortunately her last bacterial growth was resistant to most medications except Macrobid  Reculture  Start Murphy OilMacrobid-if  this is our most logical choice we will treat her for 3 weeks with this infection  If she relapses we will refer her to Dr. Beverely [place]aceAlbertson in [place] for further investigation   I have completed the patient encounter in its entirety as documented by the scribe, with editing by me where necessary. Kaikoa Magro P. Merla Richesoolittle, M.D.

## 2013-09-04 LAB — URINE CULTURE: Colony Count: >=100000

## 2014-01-23 ENCOUNTER — Telehealth: Payer: Self-pay | Admitting: *Deleted

## 2014-01-23 NOTE — Telephone Encounter (Signed)
Phoned patient & [facility] on personal mobile voicemail to contact either me directly or the office to schedule her Annual Medicare Wellness Exam-introduced myself as working with Dr. Cleta Alberts @ [facility].

## 2014-01-24 ENCOUNTER — Telehealth: Payer: Self-pay | Admitting: *Deleted

## 2014-01-24 NOTE — Telephone Encounter (Addendum)
Patient returned call regarding scheduling appointment.  Patient stated she was in the process of moving to [facility] & that although she didn't want to leave Dr. Cleta Alberts, she had started seeing Dr. Joetta Manners at [facility] on [address] in [facility], but had not see her since May.  I explained to patient my role between her provider and her insurance company & the rationale for my OV request.  Verbalized understanding & stated she had also started seeing Dr. Thelma Comp (pulmonologist) with [facility] on [address] in [place].  Stated she just saw him last week-01/16/14 & gave me authorization to call either or both doctors to obtain whatever documentation necessary.  She also stated that as much as she had been seen by Cleta Alberts, [facility], and pulmonary that she didn't see the need to come in again for a routine exam.  If she needed anything acute, she'd come to the walk in clinic, but was not interested in making an appointment at this time.  Researched for the 2 aforementioned providers ([facility]ry & Ejaz-could not find AJAZ, but found Ralph Leyden) and added to care team info.  Attempted to call pulmonary MD office and hold wait time was extreme---will attempt to contact & request 9/17 office notes again.  Reached PJ at pulmonary office and she is going to forward message to nurse to fax me 01/16/14 OV notes.

## 2014-01-27 NOTE — Telephone Encounter (Signed)
Phoned Pulmonary office back & spoke with Lynden Ang, who stated that per their records, a fax was sent to me around 11am 9/25... Fax not received, spoke with Gerarda Gunther and she is faxing them again now.

## 2014-01-27 NOTE — Telephone Encounter (Signed)
Received OV notes from Pulmonary MD, which will be added to patient's EMR media tab.

## 2014-02-05 ENCOUNTER — Ambulatory Visit (INDEPENDENT_AMBULATORY_CARE_PROVIDER_SITE_OTHER): Payer: Medicare Other | Admitting: Family Medicine

## 2014-02-05 VITALS — BP 126/72 | HR 65 | Temp 98.4°F | Resp 15 | Ht 59.0 in | Wt 237.8 lb

## 2014-02-05 DIAGNOSIS — R6889 Other general symptoms and signs: Secondary | ICD-10-CM

## 2014-02-05 DIAGNOSIS — H579 Unspecified disorder of eye and adnexa: Secondary | ICD-10-CM

## 2014-02-05 DIAGNOSIS — H578 Other specified disorders of eye and adnexa: Secondary | ICD-10-CM

## 2014-02-05 DIAGNOSIS — H109 Unspecified conjunctivitis: Secondary | ICD-10-CM

## 2014-02-05 MED ORDER — KETOTIFEN FUMARATE 0.025 % OP SOLN: 1.0000 [drp] | Freq: Two times a day (BID) | OPHTHALMIC | Status: DC

## 2014-02-05 MED ORDER — TOBRAMYCIN-DEXAMETHASONE 0.3-0.1 % OP OINT: 1.0000 "application " | TOPICAL_OINTMENT | Freq: Three times a day (TID) | OPHTHALMIC | Status: DC

## 2014-02-05 NOTE — Progress Notes (Signed)
Chief Complaint:  Chief Complaint  Patient presents with  . Eye Problem    right eye swollen since monday.  going on vacation tomorrow and wanted this checked. eye also itches     HPI: Kathy Lucas is a 68 y.o. female who is here for  Right eye swelling and itching since Monday, it was itching about 1 week before this but 3 days ago started getting red. She denies any new meds or allergens but did put a cream on her eye which she has used before ., No vision changes, no draiangae. No fevrs, chills, HAs, No glaucoma, n eye pain. Sh ehas not tried anything fo rthis, she is going on vacation  .NKI  Past Medical History  Diagnosis Date  . Allergy   . Arthritis   . Hypertension   . Depression   . COPD (chronic obstructive pulmonary disease)    Past Surgical History  Procedure Laterality Date  . Cesarean section    . Abdominal hysterectomy     History   Social History  . Marital Status: Single    Spouse Name: n/a (Divorced)    Number of Children: 2  . Years of Education: 12+   Occupational History  . Property Manager    Social History Main Topics  . Smoking status: Former Games developer  . Smokeless tobacco: Never Used  . Alcohol Use: No  . Drug Use: No  . Sexual Activity: No   Other Topics Concern  . Not on file   Social History Narrative   Lives alone.  Daughter, [name], lives nearby.   Family History  Problem Relation Age of Onset  . Cancer Sister 54    metastatic lung (+tobacco)  . Alcohol abuse Sister    Allergies  Allergen Reactions  . Levaquin [Levofloxacin In D5w] Diarrhea  . Sulfa Antibiotics    Prior to Admission medications   Medication Sig Start Date End Date Taking? Authorizing Provider  ALPRAZolam (XANAX) 0.25 MG tablet Take 0.25 mg by mouth at bedtime.   Yes Historical Provider, MD  atenolol (TENORMIN) 50 MG tablet Take 50 mg by mouth every morning.   Yes Historical Provider, MD  budesonide-formoterol (SYMBICORT) 80-4.5 MCG/ACT inhaler Inhale  2 puffs into the lungs 2 (two) times daily.   Yes Historical Provider, MD  loperamide (IMODIUM) 2 MG capsule Take 1-2 capsules (2-4 mg total) by mouth as needed for diarrhea or loose stools (4mg  X 1 now then 2 mg prn). 04/01/13  Yes Abram Sander, MD  meclizine (ANTIVERT) 12.5 MG tablet Take 12.5 mg by mouth 3 (three) times daily as needed for dizziness or nausea.    Yes Historical Provider, MD  torsemide (DEMADEX) 10 MG tablet Take 10 mg by mouth every morning.    Yes Historical Provider, MD     ROS: The patient denies fevers, chills, night sweats, unintentional weight loss, chest pain, palpitations, wheezing, dyspnea on exertion, nausea, vomiting, abdominal pain, dysuria, hematuria, melena, numbness, weakness, or tingling.   All other systems have been reviewed and were otherwise negative with the exception of those mentioned in the HPI and as above.    PHYSICAL EXAM: Filed Vitals:   02/05/14 1938  BP: 126/72  Pulse: 65  Temp: 98.4 F (36.9 C)  Resp: 15   Filed Vitals:   02/05/14 1938  Height: 4\' 11"  (1.499 m)  Weight: 237 lb 12.8 oz (107.865 kg)   Body mass index is 48 kg/(m^2).  General: Alert, no  acute distress, obese female HEENT:  Normocephalic, atraumatic, oropharynx patent. EOMI, PERRLA, minimal swellign without erythema of lids . + right eye conjunctivits, no dc Cardiovascular:  Regular rate and rhythm, no rubs murmurs or gallops.  No Carotid bruits, radial pulse intact. No pedal edema.  Respiratory: Clear to auscultation bilaterally.  No wheezes, rales, or rhonchi.  No cyanosis, no use of accessory musculature GI: No organomegaly, abdomen is soft and non-tender, positive bowel sounds.  No masses. Skin: No rashes. Neurologic: Facial musculature symmetric. Psychiatric: Patient is appropriate throughout our interaction. Lymphatic: No cervical lymphadenopathy Musculoskeletal: Gait intact.   LABS:    EKG/XRAY:   [facility]   ASSESSMENT/PLAN: Encounter Diagnoses  Name Primary?  . Conjunctivitis of right eye Yes  . Itchy eyes    Otc Zaditor antihistamine  Rx tobradex (no glaucoma) Try Zaditor first, if no improvement the try Tobradex F/u prn   Gross sideeffects, risk and benefits, and alternatives of medications d/w patient. Patient is aware that all medications have potential sideeffects and we are unable to predict every sideeffect or drug-drug interaction that may occur.  LE, THAO PHUONG, DO 02/05/2014 8:00 PM

## 2014-02-05 NOTE — Patient Instructions (Signed)
Allergic Conjunctivitis  The conjunctiva is a thin membrane that covers the visible white part of the eyeball and the underside of the eyelids. This membrane protects and lubricates the eye. The membrane has small blood vessels running through it that can normally be seen. When the conjunctiva becomes inflamed, the condition is called conjunctivitis. In response to the inflammation, the conjunctival blood vessels become swollen. The swelling results in redness in the normally white part of the eye.  The blood vessels of this membrane also react when a person has allergies and is then called allergic conjunctivitis. This condition usually lasts for as long as the allergy persists. Allergic conjunctivitis cannot be passed to another person (non-contagious). The likelihood of bacterial infection is great and the cause is not likely due to allergies if the inflamed eye has:  · A sticky discharge.  · Discharge or sticking together of the lids in the morning.  · Scaling or flaking of the eyelids where the eyelashes come out.  · Red swollen eyelids.  CAUSES   · Viruses.  · Irritants such as foreign bodies.  · Chemicals.  · General allergic reactions.  · Inflammation or serious diseases in the inside or the outside of the eye or the orbit (the boney cavity in which the eye sits) can cause a "red eye."  SYMPTOMS   · Eye redness.  · Tearing.  · Itchy eyes.  · Burning feeling in the eyes.  · Clear drainage from the eye.  · Allergic reaction due to pollens or ragweed sensitivity. Seasonal allergic conjunctivitis is frequent in the spring when pollens are in the air and in the fall.  DIAGNOSIS   This condition, in its many forms, is usually diagnosed based on the history and an ophthalmological exam. It usually involves both eyes. If your eyes react at the same time every year, allergies may be the cause. While most "red eyes" are due to allergy or an infection, the role of an eye (ophthalmological) exam is important. The exam  can rule out serious diseases of the eye or orbit.  TREATMENT   · Non-antibiotic eye drops, ointments, or medications by mouth may be prescribed if the ophthalmologist is sure the conjunctivitis is due to allergies alone.  · Over-the-counter drops and ointments for allergic symptoms should be used only after other causes of conjunctivitis have been ruled out, or as your caregiver suggests.  Medications by mouth are often prescribed if other allergy-related symptoms are present. If the ophthalmologist is sure that the conjunctivitis is due to allergies alone, treatment is normally limited to drops or ointments to reduce itching and burning.  HOME CARE INSTRUCTIONS   · Wash hands before and after applying drops or ointments, or touching the inflamed eye(s) or eyelids.  · Do not let the eye dropper tip or ointment tube touch the eyelid when putting medicine in your eye.  · Stop using your soft contact lenses and throw them away. Use a new pair of lenses when recovery is complete. You should run through sterilizing cycles at least three times before use after complete recovery if the old soft contact lenses are to be used. Hard contact lenses should be stopped. They need to be thoroughly sterilized before use after recovery.  · Itching and burning eyes due to allergies is often relieved by using a cool cloth applied to closed eye(s).  SEEK MEDICAL CARE IF:   · Your problems do not go away after two or three days of treatment.  ·   Your lids are sticky (especially in the morning when you wake up) or stick together.  · Discharge develops. Antibiotics may be needed either as drops, ointment, or by mouth.  · You have extreme light sensitivity.  · An oral temperature above 102° F (38.9° C) develops.  · Pain in or around the eye or any other visual symptom develops.  MAKE SURE YOU:   · Understand these instructions.  · Will watch your condition.  · Will get help right away if you are not doing well or get worse.  Document  Released: 07/09/2002 Document Revised: 07/11/2011 Document Reviewed: 06/04/2007  ExitCare® Patient Information ©2015 [employer]. This information is not intended to replace advice given to you by your health care provider. Make sure you discuss any questions you have with your health care provider.  Conjunctivitis  Conjunctivitis is commonly called "pink eye." Conjunctivitis can be caused by bacterial or viral infection, allergies, or injuries. There is usually redness of the lining of the eye, itching, discomfort, and sometimes discharge. There may be deposits of matter along the eyelids. A viral infection usually causes a watery discharge, while a bacterial infection causes a yellowish, thick discharge. Pink eye is very contagious and spreads by direct contact.  You may be given antibiotic eyedrops as part of your treatment. Before using your eye medicine, remove all drainage from the eye by washing gently with warm water and cotton balls. Continue to use the medication until you have awakened 2 mornings in a row without discharge from the eye. Do not rub your eye. This increases the irritation and helps spread infection. Use separate towels from other household members. Wash your hands with soap and water before and after touching your eyes. Use cold compresses to reduce pain and sunglasses to relieve irritation from light. Do not wear contact lenses or wear eye makeup until the infection is gone.  SEEK MEDICAL CARE IF:   · Your symptoms are not better after 3 days of treatment.  · You have increased pain or trouble seeing.  · The outer eyelids become very red or swollen.  Document Released: 05/26/2004 Document Revised: 07/11/2011 Document Reviewed: 04/18/2005  ExitCare® Patient Information ©2015 [employer]. This information is not intended to replace advice given to you by your health care provider. Make sure you discuss any questions you have with your health care provider.

## 2014-04-10 ENCOUNTER — Telehealth: Payer: Self-pay | Admitting: Family Medicine

## 2014-04-10 NOTE — Telephone Encounter (Signed)
Left a message for patient to come in and receive the flu shot

## 2014-05-06 ENCOUNTER — Other Ambulatory Visit (HOSPITAL_COMMUNITY): Payer: Self-pay | Admitting: Family Medicine

## 2014-05-06 DIAGNOSIS — Z1231 Encounter for screening mammogram for malignant neoplasm of breast: Secondary | ICD-10-CM

## 2014-05-13 ENCOUNTER — Ambulatory Visit (HOSPITAL_COMMUNITY): Payer: Self-pay

## 2014-07-08 ENCOUNTER — Ambulatory Visit (HOSPITAL_COMMUNITY)
Admission: RE | Admit: 2014-07-08 | Discharge: 2014-07-08 | Disposition: A | Discharge status: Home / Self Care | Payer: Medicare Other | Source: Ambulatory Visit | Attending: Family Medicine | Admitting: Family Medicine

## 2014-07-08 DIAGNOSIS — Z1231 Encounter for screening mammogram for malignant neoplasm of breast: Secondary | ICD-10-CM | POA: Diagnosis present

## 2014-07-10 ENCOUNTER — Other Ambulatory Visit (HOSPITAL_COMMUNITY): Payer: Self-pay | Admitting: Family Medicine

## 2014-07-10 DIAGNOSIS — M8580 Other specified disorders of bone density and structure, unspecified site: Secondary | ICD-10-CM

## 2014-07-24 ENCOUNTER — Ambulatory Visit (HOSPITAL_COMMUNITY)
Admission: RE | Admit: 2014-07-24 | Discharge: 2014-07-24 | Disposition: A | Discharge status: Home / Self Care | Payer: Medicare Other | Source: Ambulatory Visit | Attending: Family Medicine | Admitting: Family Medicine

## 2014-07-24 DIAGNOSIS — M8580 Other specified disorders of bone density and structure, unspecified site: Secondary | ICD-10-CM | POA: Insufficient documentation

## 2014-08-06 ENCOUNTER — Encounter: Payer: Self-pay | Admitting: *Deleted

## 2014-08-24 ENCOUNTER — Encounter (HOSPITAL_COMMUNITY): Payer: Self-pay | Admitting: Emergency Medicine

## 2014-08-24 ENCOUNTER — Emergency Department (HOSPITAL_COMMUNITY)
Admission: EM | Admit: 2014-08-24 | Discharge: 2014-08-24 | Disposition: A | Discharge status: Home / Self Care | Payer: Medicare Other | Attending: Emergency Medicine | Admitting: Emergency Medicine

## 2014-08-24 ENCOUNTER — Emergency Department (HOSPITAL_COMMUNITY): Payer: Medicare Other

## 2014-08-24 DIAGNOSIS — Z8739 Personal history of other diseases of the musculoskeletal system and connective tissue: Secondary | ICD-10-CM | POA: Insufficient documentation

## 2014-08-24 DIAGNOSIS — Z87891 Personal history of nicotine dependence: Secondary | ICD-10-CM | POA: Insufficient documentation

## 2014-08-24 DIAGNOSIS — Z79899 Other long term (current) drug therapy: Secondary | ICD-10-CM | POA: Insufficient documentation

## 2014-08-24 DIAGNOSIS — I10 Essential (primary) hypertension: Secondary | ICD-10-CM | POA: Insufficient documentation

## 2014-08-24 DIAGNOSIS — F329 Major depressive disorder, single episode, unspecified: Secondary | ICD-10-CM | POA: Diagnosis not present

## 2014-08-24 DIAGNOSIS — B029 Zoster without complications: Secondary | ICD-10-CM

## 2014-08-24 DIAGNOSIS — J449 Chronic obstructive pulmonary disease, unspecified: Secondary | ICD-10-CM | POA: Diagnosis not present

## 2014-08-24 DIAGNOSIS — R079 Chest pain, unspecified: Secondary | ICD-10-CM | POA: Diagnosis present

## 2014-08-24 LAB — I-STAT TROPONIN, ED: Troponin i, poc: 0 ng/mL (ref 0.00–0.08)

## 2014-08-24 LAB — I-STAT CHEM 8, ED
BUN: 16 mg/dL (ref 6–23)
Calcium, Ion: 1.15 mmol/L (ref 1.13–1.30)
Chloride: 101 mmol/L (ref 96–112)
Creatinine, Ser: 0.8 mg/dL (ref 0.50–1.10)
Glucose, Bld: 101 mg/dL — ABNORMAL HIGH (ref 70–99)
HCT: 45 % (ref 36.0–46.0)
Hemoglobin: 15.3 g/dL — ABNORMAL HIGH (ref 12.0–15.0)
Potassium: 4.3 mmol/L (ref 3.5–5.1)
Sodium: 140 mmol/L (ref 135–145)
TCO2: 26 mmol/L (ref 0–100)

## 2014-08-24 MED ORDER — OXYCODONE-ACETAMINOPHEN 5-325 MG PO TABS
1.0000 | ORAL_TABLET | Freq: Once | ORAL | Status: AC
  Administered 2014-08-24: 1 via ORAL
  Filled 2014-08-24: qty 1

## 2014-08-24 MED ORDER — VALACYCLOVIR HCL 1 G PO TABS: 1000.0000 mg | ORAL_TABLET | Freq: Three times a day (TID) | ORAL | Status: AC

## 2014-08-24 MED ORDER — HYDROMORPHONE HCL 1 MG/ML IJ SOLN
1.0000 mg | Freq: Once | INTRAMUSCULAR | Status: DC
Start: 2014-08-24 — End: 2014-08-25
  Filled 2014-08-24: qty 1

## 2014-08-24 MED ORDER — OXYCODONE-ACETAMINOPHEN 5-325 MG PO TABS
1.0000 | ORAL_TABLET | Freq: Four times a day (QID) | ORAL | Status: DC | PRN
Start: 2014-08-24 — End: 2015-08-20

## 2014-08-24 NOTE — ED Notes (Addendum)
Pt states that she has had continuing chest wall pain x 1 week to several months that worsens with palpation. [place] and cleared. Hx of anxiety. Alert and oriented. EKG normal per EMS. Swelling in her legs is normal for her.

## 2014-08-24 NOTE — Discharge Instructions (Signed)
Follow-up with your primary care doctor.  Return here as needed   Shingles Shingles (herpes zoster) is an infection that is caused by the same virus that causes chickenpox (varicella). The infection causes a painful skin rash and fluid-filled blisters, which eventually break open, crust over, and heal. It may occur in any area of the body, but it usually affects only one side of the body or face. The pain of shingles usually lasts about 1 month. However, some people with shingles may develop long-term (chronic) pain in the affected area of the body. Shingles often occurs many years after the person had chickenpox. It is more common:  In people older than 50 years.  In people with weakened immune systems, such as those with HIV, AIDS, or cancer.  In people taking medicines that weaken the immune system, such as transplant medicines.  In people under great stress. CAUSES  Shingles is caused by the varicella zoster virus (VZV), which also causes chickenpox. After a person is infected with the virus, it can remain in the person's body for years in an inactive state (dormant). To cause shingles, the virus reactivates and breaks out as an infection in a nerve root. The virus can be spread from person to person (contagious) through contact with open blisters of the shingles rash. It will only spread to people who have not had chickenpox. When these people are exposed to the virus, they may develop chickenpox. They will not develop shingles. Once the blisters scab over, the person is no longer contagious and cannot spread the virus to others. SIGNS AND SYMPTOMS  Shingles shows up in stages. The initial symptoms may be pain, itching, and tingling in an area of the skin. This pain is usually described as burning, stabbing, or throbbing.In a few days or weeks, a painful red rash will appear in the area where the pain, itching, and tingling were felt. The rash is usually on one side of the body in a band or  belt-like pattern. Then, the rash usually turns into fluid-filled blisters. They will scab over and dry up in approximately 2-3 weeks. Flu-like symptoms may also occur with the initial symptoms, the rash, or the blisters. These may include:  Fever.  Chills.  Headache.  Upset stomach. DIAGNOSIS  Your health care provider will perform a skin exam to diagnose shingles. Skin scrapings or fluid samples may also be taken from the blisters. This sample will be examined under a microscope or sent to a lab for further testing. TREATMENT  There is no specific cure for shingles. Your health care provider will likely prescribe medicines to help you manage the pain, recover faster, and avoid long-term problems. This may include antiviral drugs, anti-inflammatory drugs, and pain medicines. HOME CARE INSTRUCTIONS   Take a cool bath or apply cool compresses to the area of the rash or blisters as directed. This may help with the pain and itching.   Take medicines only as directed by your health care provider.   Rest as directed by your health care provider.  Keep your rash and blisters clean with mild soap and cool water or as directed by your health care provider.  Do not pick your blisters or scratch your rash. Apply an anti-itch cream or numbing creams to the affected area as directed by your health care provider.  Keep your shingles rash covered with a loose bandage (dressing).  Avoid skin contact with:  Babies.   Pregnant women.   Children with eczema.  Elderly people with transplants.   People with chronic illnesses, such as leukemia or AIDS.   Wear loose-fitting clothing to help ease the pain of material rubbing against the rash.  Keep all follow-up visits as directed by your health care provider.If the area involved is on your face, you may receive a referral for a specialist, such as an eye doctor (ophthalmologist) or an ear, nose, and throat (ENT) doctor. Keeping all  follow-up visits will help you avoid eye problems, chronic pain, or disability.  SEEK IMMEDIATE MEDICAL CARE IF:   You have facial pain, pain around the eye area, or loss of feeling on one side of your face.  You have ear pain or ringing in your ear.  You have loss of taste.  Your pain is not relieved with prescribed medicines.   Your redness or swelling spreads.   You have more pain and swelling.  Your condition is worsening or has changed.   You have a fever. MAKE SURE YOU:  Understand these instructions.  Will watch your condition.  Will get help right away if you are not doing well or get worse. Document Released: 04/18/2005 Document Revised: 09/02/2013 Document Reviewed: 12/01/2011 [facility] Patient Information 2015 [place], [place]. This information is not intended to replace advice given to you by your health care provider. Make sure you discuss any questions you have with your health care provider.

## 2014-08-24 NOTE — ED Provider Notes (Signed)
CSN: [id number]     Arrival date & time 08/24/14  1624 History   First MD Initiated Contact with Patient 08/24/14 1626     Chief Complaint  Patient presents with  . Chest Wall Pain      (Consider location/radiation/quality/duration/timing/severity/associated sxs/prior Treatment) HPI  Kathy Lucas is a 69 yo female with HTN who presents to the ED with central chest pain since Wednesday. [place] and she says EKG/bloodwork was negative. On Friday, the pain started to go to her back. She describes the pain as a "bad ache," constant and sometimes sharp. She has taken Ranitidine and Tylenol with no relief. She also endorses palpitations that she describes as "skipping beats." Endorses LE edema that is normal for her and partially resolves at night and worsens throughout the day. She denies fever, chills, fatigue, headache, dizziness, syncope, SOB, nausea, vomiting, diarrhea, constipation, abdominal pain, dysuria, weakness.    Past Medical History  Diagnosis Date  . Allergy   . Arthritis   . Hypertension   . Depression   . COPD (chronic obstructive pulmonary disease)    Past Surgical History  Procedure Laterality Date  . Cesarean section    . Abdominal hysterectomy     Family History  Problem Relation Age of Onset  . Cancer Sister 42    metastatic lung (+tobacco)  . Alcohol abuse Sister    History  Substance Use Topics  . Smoking status: Former Games developer  . Smokeless tobacco: Never Used  . Alcohol Use: No   OB History    No data available     Review of Systems  All other systems negative except as documented in the HPI. All pertinent positives and negatives as reviewed in the HPI.   Allergies  Levaquin and Sulfa antibiotics  Home Medications   Prior to Admission medications   Medication Sig Start Date End Date Taking? Authorizing Provider  ALPRAZolam (XANAX) 0.25 MG tablet Take 0.25 mg by mouth at bedtime.    Historical Provider,  MD  atenolol (TENORMIN) 50 MG tablet Take 50 mg by mouth every morning.    Historical Provider, MD  budesonide-formoterol (SYMBICORT) 80-4.5 MCG/ACT inhaler Inhale 2 puffs into the lungs 2 (two) times daily.    Historical Provider, MD  ketotifen (ZADITOR) 0.025 % ophthalmic solution Place 1 drop into the right eye 2 (two) times daily. 02/05/14   Thao P Le, DO  loperamide (IMODIUM) 2 MG capsule Take 1-2 capsules (2-4 mg total) by mouth as needed for diarrhea or loose stools (  X 1 now then 2 mg prn). 04/01/13   Abram Sander, MD  meclizine (ANTIVERT) 12.5 MG tablet Take 12.5 mg by mouth 3 (three) times daily as needed for dizziness or nausea.     Historical Provider, MD  tobramycin-dexamethasone Wallene Dales) ophthalmic ointment Place 1 application into the right eye 3 (three) times daily. To affected eye. Do not use more than 5 days 02/05/14   Thao P Le, DO  torsemide (DEMADEX) 10 MG tablet Take 10 mg by mouth every morning.     Historical Provider, MD   BP 142/82 mmHg  Pulse 52  Temp(Src) 98.6 F (37 C) (Oral)  Resp 19  SpO2 97% Physical Exam  Constitutional: She is oriented to person, place, and time. She appears well-developed and well-nourished.  HENT:  Head: Normocephalic and atraumatic.  Mouth/Throat: Oropharynx is clear and moist and mucous membranes are normal.  Eyes: Conjunctivae are normal. Pupils are  equal, round, and reactive to light.  Neck: Normal range of motion. Neck supple. No thyromegaly present.  Cardiovascular: Normal rate, regular rhythm, S1 normal, S2 normal and normal heart sounds.  Exam reveals no gallop and no friction rub.   No murmur heard. Tenderness to palpation of the central and left chest  Pulmonary/Chest: Effort normal and breath sounds normal.  Abdominal: Soft. Normal appearance and bowel sounds are normal. There is no hepatosplenomegaly. There is no tenderness. There is no CVA tenderness.  Musculoskeletal: Normal range of motion.  Neurological: She is alert  and oriented to person, place, and time. She exhibits normal muscle tone. Coordination normal.  Skin: Skin is warm and dry. Rash noted. No erythema.  Vesicular rash with red base present in the left T5 dermatomal distribution, anterior to posterior, no weeping or crusting of lesions  Nursing note and vitals reviewed.   ED Course  Procedures (including critical care time) Labs Review Labs Reviewed  I-STAT CHEM 8, ED - Abnormal; Notable for the following:    Glucose, Bld 101 (*)    Hemoglobin 15.3 (*)    All other components within normal limits  Rosezena SensorI-STAT TROPOININ, ED    Imaging Review Dg Chest 2 View  08/24/2014   CLINICAL DATA:  69 year old female with chest pain  EXAM: CHEST - 2 VIEW  COMPARISON:  05/15/2014  FINDINGS: Cardiomediastinal silhouette projects within normal limits in size and contour. Atherosclerosis. No confluent airspace disease, pneumothorax, or pleural effusion.  No displaced fracture.  Unremarkable appearance of the upper abdomen.  IMPRESSION: No radiographic evidence of acute cardiopulmonary disease.  Atherosclerosis.  Signed,  Yvone NeuJaime S. Loreta AveWagner, DO  [facility]  [facility]   Electronically Signed   By: Gilmer MorJaime  Wagner D.O.   On: 08/24/2014 20:16     EKG Interpretation None       The patient has a shingles rash that is causing her pain.  The patient is advised follow-up with her primary care Dr. told to return here as needed     Charlestine NightChristopher Kallie Depolo, PA-C 08/25/14 [id number]  Bethann BerkshireJoseph Zammit, MD 08/26/14 [phone]

## 2014-09-03 ENCOUNTER — Telehealth: Payer: Self-pay

## 2014-09-03 NOTE — Telephone Encounter (Signed)
Pt's daughter, Gloris Manchesterraci, called and reported that pt was in ED recently for shingles and is still in excruciating pain and can hardly walk across hall to bathroom. She is unable to take the Percocet Rxd d/t nausea and is also having trouble taking full dose of Valtrex d/t nausea. She was able to get a RF of phenergan to help w/nausea some, but still needs something for pain that she can take. I advised her that we would need to see pt in order to Rx any medication, even if not narcotic because we have not seen pt for exam recently. Pt should also be re-checked for status of shingles if she is still having so much trouble. I suggested we might be able to get her appt tomorrow so that she would not have to wait so long at walk in. [facility]

## 2014-09-04 ENCOUNTER — Ambulatory Visit: Payer: Self-pay | Admitting: Urgent Care

## 2014-10-17 ENCOUNTER — Encounter: Payer: Self-pay | Admitting: *Deleted

## 2014-12-23 ENCOUNTER — Telehealth: Payer: Self-pay | Admitting: *Deleted

## 2014-12-23 NOTE — Telephone Encounter (Signed)
Phoned patient and got voicemail & [facility] with name, title, location, PCP, my direct # and main office #, explaining the attempt to schedule her annual wellness visit.

## 2014-12-24 ENCOUNTER — Telehealth: Payer: Self-pay | Admitting: *Deleted

## 2014-12-24 NOTE — Telephone Encounter (Signed)
Returned patient's call (she was responding to mine) and she said she was in the process of moving to [place], and had already started seeing a provider there for her primary care.  Stated she may still come to the walk in as needed, but that her PCP was in [facility] now.  Removed Dr. Cleta Alberts as PCP

## 2014-12-24 NOTE — Telephone Encounter (Signed)
Please call patient and let her know it has been my pleasure to care for her. If that there is anything I can do in the future  please let me know.

## 2014-12-27 NOTE — Telephone Encounter (Signed)
Left message on voicemail.

## 2015-04-23 IMAGING — CR DG SHOULDER 2+V*R*
3 series · 3 of 3 positions shown · non-contrast
Comparison: None.

CLINICAL DATA: Pain

EXAM:
RIGHT SHOULDER - 2+ VIEW

[AP]
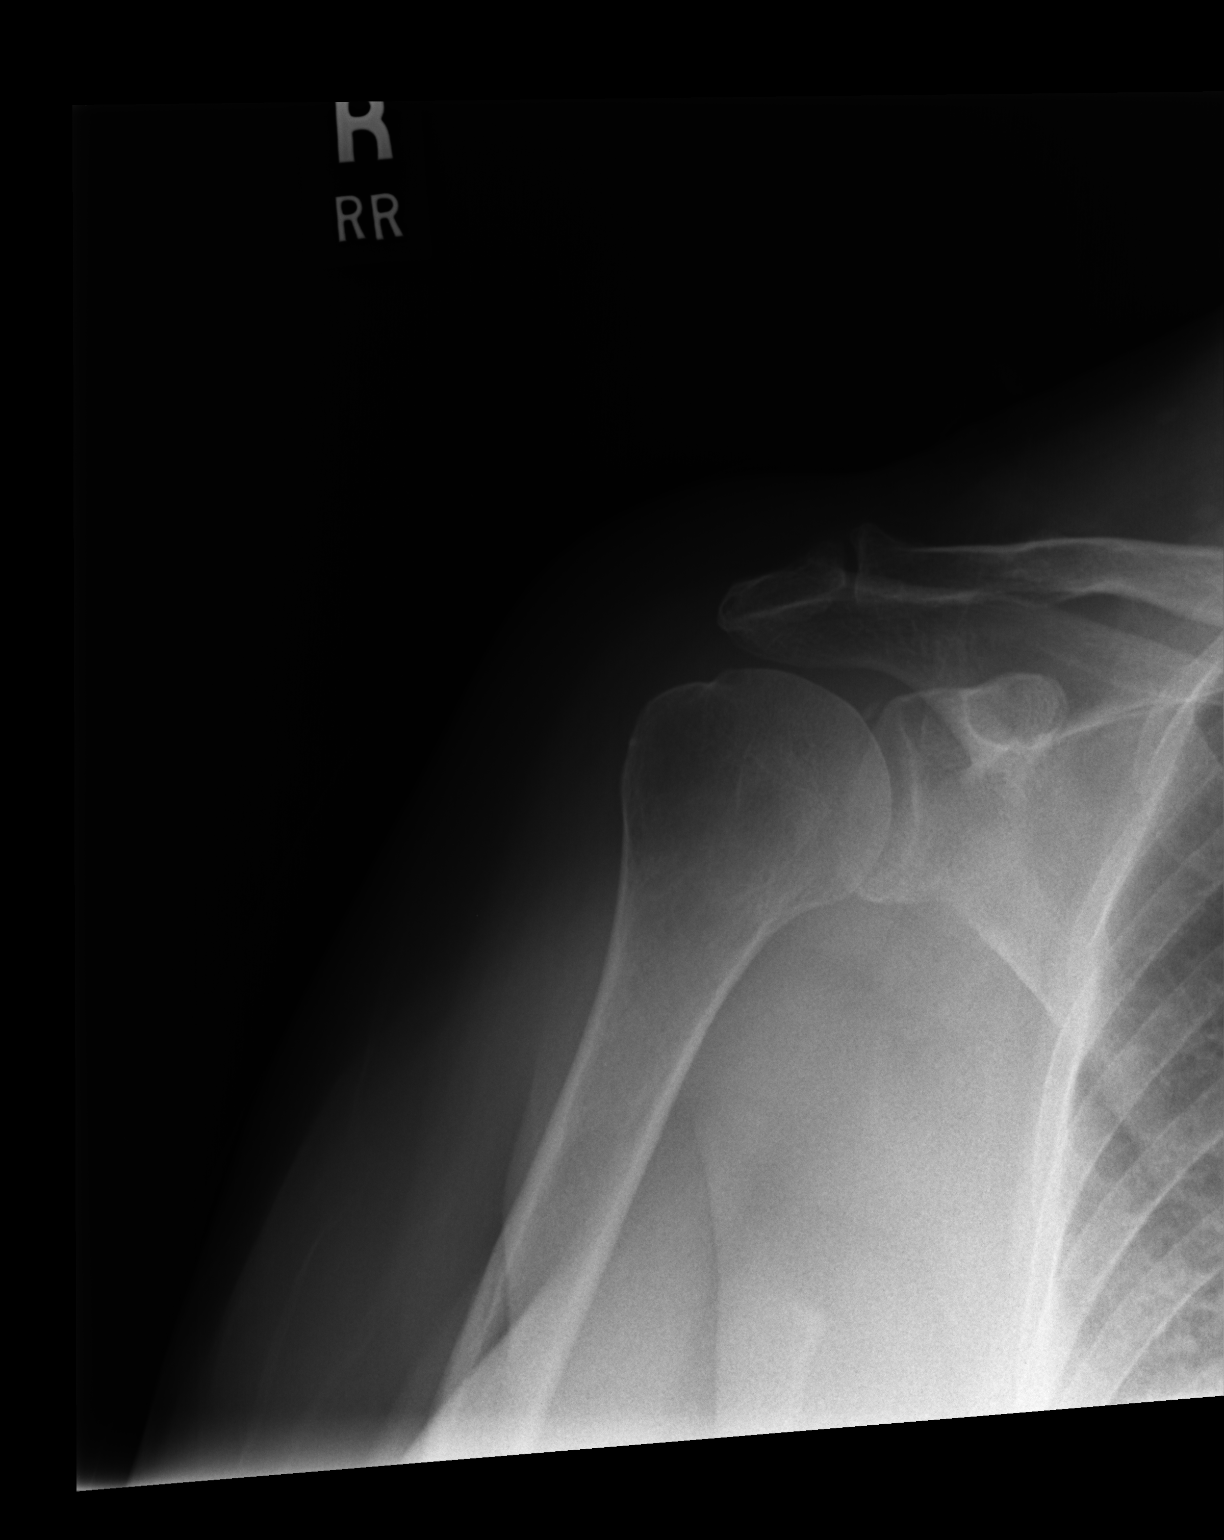

[axial]
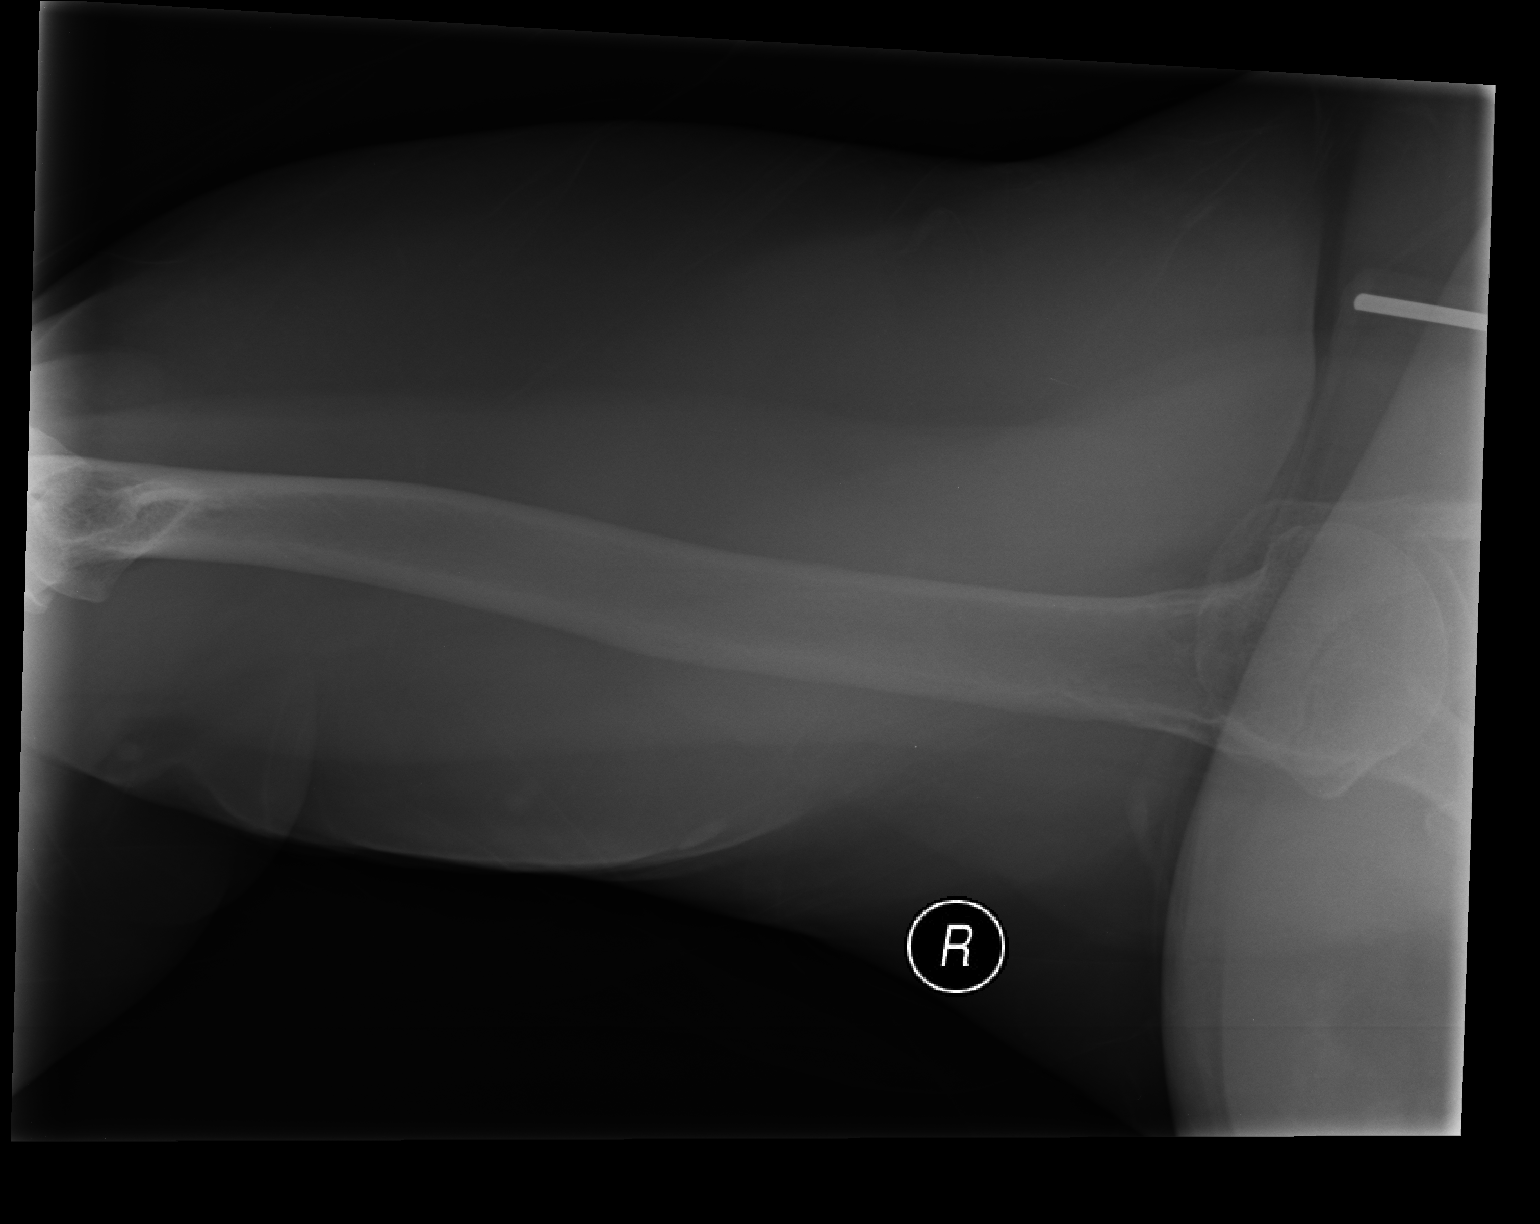

[pa y view]
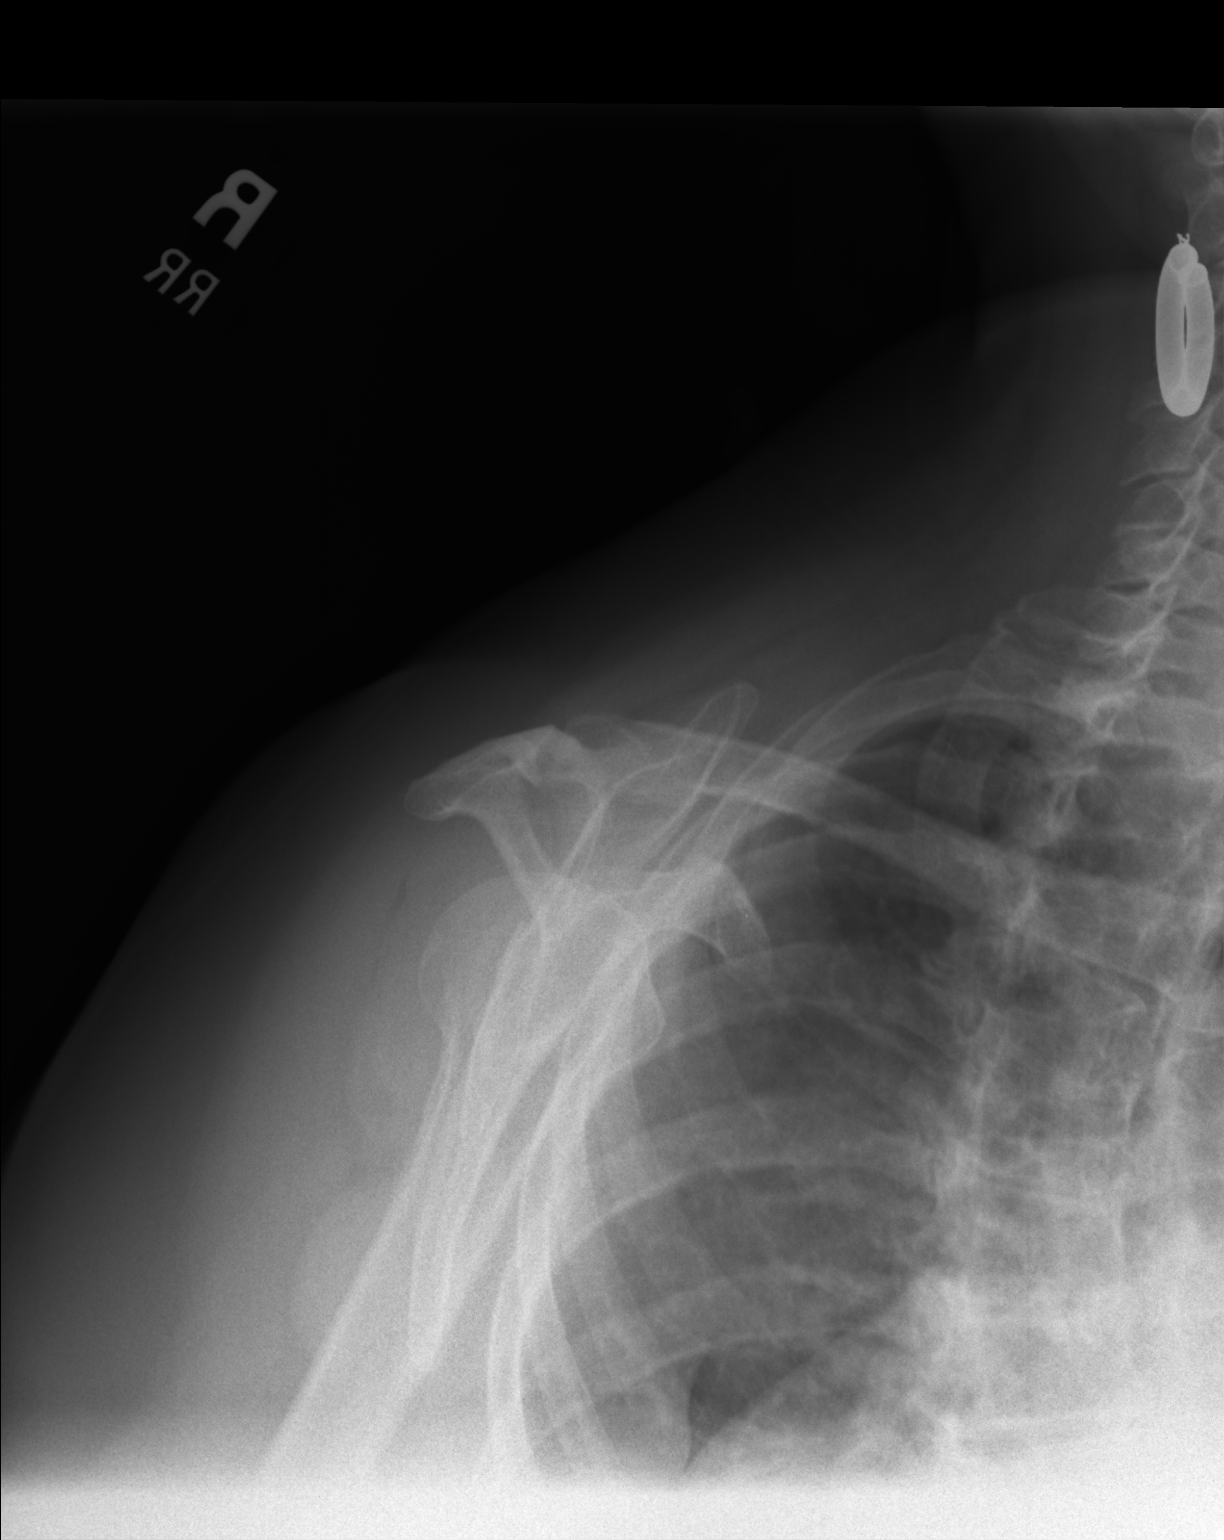

[3 of 3 positions shown; findings below may reference images not displayed]

FINDINGS: Normal alignment no fracture. Minimal degenerative change in the AC
joint. Calcified ossicle adjacent to the posterior superior labrum
of the glenoid fossa appears chronic. Next no acute fracture
IMPRESSION: Negative.

## 2015-07-22 ENCOUNTER — Ambulatory Visit (INDEPENDENT_AMBULATORY_CARE_PROVIDER_SITE_OTHER): Payer: Medicare Other | Admitting: Family Medicine

## 2015-07-22 VITALS — BP 148/80 | HR 61 | Temp 98.8°F | Resp 20 | Ht 59.0 in | Wt 243.0 lb

## 2015-07-22 DIAGNOSIS — J45901 Unspecified asthma with (acute) exacerbation: Secondary | ICD-10-CM

## 2015-07-22 MED ORDER — ALBUTEROL SULFATE HFA 108 (90 BASE) MCG/ACT IN AERS: 2.0000 | INHALATION_SPRAY | Freq: Four times a day (QID) | RESPIRATORY_TRACT | Status: DC | PRN

## 2015-07-22 MED ORDER — CEFDINIR 300 MG PO CAPS: 300.0000 mg | ORAL_CAPSULE | Freq: Two times a day (BID) | ORAL | Status: DC

## 2015-07-22 MED ORDER — PREDNISONE 20 MG PO TABS: ORAL_TABLET | ORAL | Status: DC

## 2015-07-22 NOTE — Progress Notes (Signed)
   HPI  Patient presents today here with cough and malaise  Pt explains she has had this illness with malaise, productive cough of thick green sputum, mild dyspnea for about 2 weeks.  She states that previous in t he course she had subjective fevers, these have resolved She has missed several days of work during this time.   She is tolerating foods and fluids normally She denies chest pain and labored brathing  She has been seen by pulmonology and Dx with asthma and OSA  PMH: Smoking status noted ROS: Per HPI  Objective: BP 148/80 mmHg  Pulse 61  Temp(Src) 98.8 F (37.1 C) (Oral)  Resp 20  Ht 4\' 11"  (1.499 m)  Wt 243 lb (110.224 kg)  BMI 49.05 kg/m2  SpO2 97% Gen: NAD, alert, cooperative with exam HEENT: NCAT, Tms WNL, no sinus tenderness to palp, nares clear, oropharynx clear CV: RRR, good S1/S2, no murmur Resp: CTABL, no wheezes, non-labored Ext: No edema, warm Neuro: Alert and oriented, No gross deficits  Assessment and plan:  # asthma exacerbation Considering severe cough and prolonged synps wil treat aggressively Omnicef, prednisone, albuterol Supportive care discussed Yogurt or probiotic encouraged RTC with any worsening or failure to improve.     Meds ordered this encounter  Medications  . predniSONE (DELTASONE) 20 MG tablet    Sig: 2 po at same time daily for 5 days    Dispense:  10 tablet    Refill:  0  . albuterol (PROVENTIL HFA;VENTOLIN HFA) 108 (90 Base) MCG/ACT inhaler    Sig: Inhale 2 puffs into the lungs every 6 (six) hours as needed for wheezing or shortness of breath.    Dispense:  1 Inhaler    Refill:  0  . cefdinir (OMNICEF) 300 MG capsule    Sig: Take 1 capsule (300 mg total) by mouth 2 (two) times daily. 1 po BID    Dispense:  20 capsule    Refill:  0    Murtis SinkSam Conley Pawling, MD [facility] 07/22/2015, 4:32 PM

## 2015-07-22 NOTE — Patient Instructions (Addendum)
Great to meet you!  Come back with any concerns  I am treating you for an asthma exacerbation, the medicine s would effectively treat pneumonia if it is present as well  Try albuterol for cough Prednisone will help the cough as well, it will take 12-18 hours to take full effect  omnicef is an antibiotic, eat a yogurt daily while you are on it.

## 2015-08-20 ENCOUNTER — Ambulatory Visit (INDEPENDENT_AMBULATORY_CARE_PROVIDER_SITE_OTHER): Payer: Medicare Other | Admitting: Family Medicine

## 2015-08-20 VITALS — BP 138/84 | HR 74 | Temp 97.9°F | Resp 18 | Wt 247.0 lb

## 2015-08-20 DIAGNOSIS — J209 Acute bronchitis, unspecified: Secondary | ICD-10-CM | POA: Diagnosis not present

## 2015-08-20 MED ORDER — PREDNISONE 20 MG PO TABS
ORAL_TABLET | ORAL | Status: DC
Start: 2015-08-20 — End: 2016-09-13

## 2015-08-20 MED ORDER — AZITHROMYCIN 250 MG PO TABS: ORAL_TABLET | ORAL | Status: DC

## 2015-08-20 MED ORDER — ALBUTEROL SULFATE HFA 108 (90 BASE) MCG/ACT IN AERS: 2.0000 | INHALATION_SPRAY | Freq: Four times a day (QID) | RESPIRATORY_TRACT | Status: DC | PRN

## 2015-08-20 NOTE — Progress Notes (Signed)
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  It is associated with aches but no fever.  Patient seems to have seasonal allergies every year at this time  She was seen two weeks ago with similar illness which cleared with prednisone and cefdinir   Objective: BP 138/84 mmHg  Pulse 74  Temp(Src) 97.9 F (36.6 C) (Oral)  Resp 18  Wt 247 lb (112.038 kg)  SpO2 96% Chest:  Few basilar rales HEENT:  Clear pharynx, ear canals, TM's Heart:  Reg, no murmur  Acute bronchitis, unspecified organism - Plan: albuterol (PROVENTIL HFA;VENTOLIN HFA) 108 (90 Base) MCG/ACT inhaler, predniSONE (DELTASONE) 20 MG tablet, azithromycin (ZITHROMAX) 250 MG tablet  Elvina SidleKurt Megyn Leng, MD

## 2015-08-20 NOTE — Patient Instructions (Addendum)

## 2016-09-13 ENCOUNTER — Encounter: Payer: Self-pay | Admitting: Family Medicine

## 2016-09-13 ENCOUNTER — Ambulatory Visit (INDEPENDENT_AMBULATORY_CARE_PROVIDER_SITE_OTHER): Payer: Medicare Other | Admitting: Family Medicine

## 2016-09-13 VITALS — BP 148/84 | HR 71 | Temp 98.2°F | Resp 18 | Ht 59.0 in | Wt 252.8 lb

## 2016-09-13 DIAGNOSIS — R300 Dysuria: Secondary | ICD-10-CM | POA: Diagnosis not present

## 2016-09-13 DIAGNOSIS — J4520 Mild intermittent asthma, uncomplicated: Secondary | ICD-10-CM | POA: Diagnosis not present

## 2016-09-13 DIAGNOSIS — J209 Acute bronchitis, unspecified: Secondary | ICD-10-CM

## 2016-09-13 DIAGNOSIS — N3000 Acute cystitis without hematuria: Secondary | ICD-10-CM | POA: Diagnosis not present

## 2016-09-13 LAB — POC MICROSCOPIC URINALYSIS (UMFC): Mucus: ABSENT

## 2016-09-13 LAB — POCT URINALYSIS DIP (MANUAL ENTRY)
BILIRUBIN UA: NEGATIVE
BILIRUBIN UA: NEGATIVE mg/dL
Blood, UA: NEGATIVE
Glucose, UA: NEGATIVE mg/dL
Nitrite, UA: NEGATIVE
Protein Ur, POC: NEGATIVE mg/dL
Spec Grav, UA: 1.02 (ref 1.010–1.025)
Urobilinogen, UA: 0.2 E.U./dL
pH, UA: 5 (ref 5.0–8.0)

## 2016-09-13 MED ORDER — ALBUTEROL SULFATE HFA 108 (90 BASE) MCG/ACT IN AERS: 2.0000 | INHALATION_SPRAY | Freq: Four times a day (QID) | RESPIRATORY_TRACT | 0 refills | Status: AC | PRN

## 2016-09-13 MED ORDER — CEPHALEXIN 500 MG PO CAPS: 500.0000 mg | ORAL_CAPSULE | Freq: Two times a day (BID) | ORAL | 0 refills | Status: AC

## 2016-09-13 NOTE — Progress Notes (Signed)
Subjective:  By signing my name below, I, Essence Howell, attest that this documentation has been prepared under the direction and in the presence of Shade Flood, MD Electronically Signed: Charline Bills, ED Scribe 09/13/2016 at 12:37 PM.   Patient ID: Kathy Lucas, female    DOB: 06/18/1945, 71 y.o.   MRN: [id number]  Chief Complaint  Patient presents with  . Urinary Frequency    and urgency x 1 week   . Dysuria  . Medication Refill    Albuterol    HPI Kathy Lucas is a 71 y.o. female who presents to Primary Care at [facility] complaining of urinary frequency first noticed 5-6 days ago. Pt reports associated symptoms of urinary urgency, dysuria at initial onset and end of flow and malodorous urine. She has tried increasing her water intake without significant relief. She has not tried OTC medications or cranberry juice. E.coli on urine culture in 2015 with multiple resistance but sensitive to Macrobid. Pt reports that her last UTI was last year. She denies fever, chills, abdominal pain, flank pain, vaginal discharge, vaginal bleeding. Allergy to Augmentin, Levaquin and Sulfa.   Asthma Pt uses albuterol 1-2 times a week. She was been treated by pulmonary for asthma in the past. States she ran out of her inhaler a few months ago. Pt denies waking up sob or chest pain.   Patient Active Problem List   Diagnosis Date Noted  . Asthma with exacerbation 07/22/2015  . BMI 45.0-49.9, adult (HCC) 03/10/2013  . HTN (hypertension) 03/03/2012  . Arthritis 03/03/2012  . Anxiety 03/03/2012  . 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 Allergy   . Arthritis   . COPD (chronic obstructive pulmonary disease) (HCC)   . Depression   . Hypertension    Past Surgical History:  Procedure Laterality Date  . ABDOMINAL HYSTERECTOMY    . CESAREAN SECTION     Allergies  Allergen Reactions  . Augmentin [Amoxicillin-Pot Clavulanate] Diarrhea  . Levaquin  [Levofloxacin In D5w] Diarrhea  . Sulfa Antibiotics    Prior to Admission medications   Medication Sig Start Date End Date Taking? Authorizing Provider  acetaminophen (TYLENOL) 500 MG tablet Take 1,000 mg by mouth every 6 (six) hours as needed for moderate pain (osteoarthritis).   Yes [provider]  atenolol (TENORMIN) 50 MG tablet Take 50 mg by mouth every morning.   Yes [provider]  atorvastatin (LIPITOR) 20 MG tablet Take 20 mg by mouth at bedtime.  08/04/14  Yes [provider]  KLOR-CON M20 20 MEQ tablet Take 20 mEq by mouth daily. Reported on 08/20/2015 08/11/14  Yes [provider]  meclizine (ANTIVERT) 12.5 MG tablet Take 12.5 mg by mouth 3 (three) times daily as needed for dizziness or nausea (dizziness).    Yes [provider]  Multiple Vitamins-Minerals (CENTRUM ADULTS PO) Take 1 tablet by mouth at bedtime.   Yes [provider]  torsemide (DEMADEX) 10 MG tablet Take 10 mg by mouth every morning.    Yes [provider]  albuterol (PROVENTIL HFA;VENTOLIN HFA) 108 (90 Base) MCG/ACT inhaler Inhale 2 puffs into the lungs every 6 (six) hours as needed for wheezing or shortness of breath. Patient not taking: Reported on 09/13/2016 08/20/15   Elvina Sidle, MD  ALPRAZolam Prudy Feeler) 0.25 MG tablet Take 0.25 mg by mouth at bedtime.    [provider]  azithromycin (ZITHROMAX) 250 MG tablet Take 2 tabs PO x 1  dose, then 1 tab PO QD x 4 days Patient not taking: Reported on 09/13/2016 08/20/15   Elvina Sidle, MD  cefdinir (OMNICEF) 300 MG capsule Take 1 capsule (300 mg total) by mouth 2 (two) times daily. 1 po BID Patient not taking: Reported on 08/20/2015 07/22/15   Elenora Gamma, MD  predniSONE (DELTASONE) 20 MG tablet 2 po at same time daily for 5 days Patient not taking: Reported on 09/13/2016 08/20/15   Elvina Sidle, MD   Social History   Social History  . Marital status: Single    Spouse name: n/a  (Divorced)  . Number of children: 2  . Years of education: 12+   Occupational History  . Property Manager [employer]   Social History Main Topics  . Smoking status: Former Games developer  . Smokeless tobacco: Never Used  . Alcohol use No  . Drug use: No  . Sexual activity: No   Other Topics Concern  . Not on file   Social History Narrative   Lives alone.  Daughter, [name], lives nearby.   Review of Systems  Constitutional: Negative for chills and fever.  Respiratory: Negative for shortness of breath.   Cardiovascular: Negative for chest pain.  Gastrointestinal: Negative for abdominal pain.  Genitourinary: Positive for dysuria, frequency and urgency. Negative for flank pain, vaginal bleeding and vaginal discharge.      Objective:   Physical Exam  Constitutional: She is oriented to person, place, and time. She appears well-developed and well-nourished. No distress.  HENT:  Head: Normocephalic and atraumatic.  Eyes: Conjunctivae and EOM are normal.  Neck: Neck supple. No tracheal deviation present.  Cardiovascular: Normal rate.   Pulmonary/Chest: Effort normal. No respiratory distress.  Abdominal: Soft. Bowel sounds are normal. She exhibits no distension. There is no tenderness. There is no CVA tenderness.  Musculoskeletal: Normal range of motion.  Neurological: She is alert and oriented to person, place, and time.  Skin: Skin is warm and dry.  Psychiatric: She has a normal mood and affect. Her behavior is normal.  Nursing note and vitals reviewed.  Vitals:   09/13/16 1226  BP: (!) 169/90  Pulse: 71  Resp: 18  Temp: 98.2 F (36.8 C)  TempSrc: Oral  SpO2: 98%  Weight: 252 lb 12.8 oz (114.7 kg)  Height: 4\' 11"  (1.499 m)   Results for orders placed or performed in visit on 09/13/16  POCT Microscopic Urinalysis ([facility])  Result Value Ref Range   WBC,UR,HPF,POC Too numerous to count  (A) None WBC/hpf   RBC,UR,HPF,POC None None RBC/hpf   Bacteria Moderate (A)  None, Too numerous to count   Mucus Absent Absent   Epithelial Cells, UR Per Microscopy Few (A) None, Too numerous to count cells/hpf  POCT urinalysis dipstick  Result Value Ref Range   Color, UA yellow yellow   Clarity, UA clear clear   Glucose, UA negative negative mg/dL   Bilirubin, UA negative negative   Ketones, POC UA negative negative mg/dL   Spec Grav, UA 0.981 1.914 - 1.025   Blood, UA negative negative   pH, UA 5.0 5.0 - 8.0   Protein Ur, POC negative negative mg/dL   Urobilinogen, UA 0.2 0.2 or 1.0 E.U./dL   Nitrite, UA Negative Negative   Leukocytes, UA Small (1+) (A) Negative      Assessment & Plan:    Kathy Lucas is a 72 y.o. female Dysuria - Plan: POCT Microscopic Urinalysis ([facility]), POCT urinalysis dipstick, Urine culture Acute cystitis without hematuria  -  check culture, start Keflex as tolerated previously. Push fluids, RTC precautions. Follow urine culture for sensitivities as last Escherichia coli did have some resistance, but that was 3 years ago. RTC precautions if worsening.  Mild intermittent asthma without complication Plan: albuterol (PROVENTIL HFA;VENTOLIN HFA) 108 (90 Base) MCG/ACT inhaler  -rare use prior, refilled albuterol, but RTC precautions given if persistent/frequent use.   Meds ordered this encounter  Medications  . cephALEXin (KEFLEX) 500 MG capsule    Sig: Take 1 capsule (500 mg total) by mouth 2 (two) times daily.    Dispense:  20 capsule    Refill:  0  . albuterol (PROVENTIL HFA;VENTOLIN HFA) 108 (90 Base) MCG/ACT inhaler    Sig: Inhale 2 puffs into the lungs every 6 (six) hours as needed for wheezing or shortness of breath.    Dispense:  1 Inhaler    Refill:  0   Patient Instructions  Start Keflex one pill twice per day for 10 days. Make sure to drink plenty of fluids. I will check a urine culture, and will let you know if any medication changes needed. If you are worsening, return for recheck.  For asthma, okay to use albuterol  if needed for breakthrough symptoms.  If you do have need for that medication more than twice per week or any nighttime symptoms, return to discuss other treatment.   Return to the clinic or go to the nearest emergency room if any of your symptoms worsen or new symptoms occur.    Urinary Tract Infection, Adult A urinary tract infection (UTI) is an infection of any part of the urinary tract, which includes the kidneys, ureters, bladder, and urethra. These organs make, store, and get rid of urine in the body. UTI can be a bladder infection (cystitis) or kidney infection (pyelonephritis). What are the causes? This infection may be caused by fungi, viruses, or bacteria. Bacteria are the most common cause of UTIs. This condition can also be caused by repeated incomplete emptying of the bladder during urination. What increases the risk? This condition is more likely to develop if:  You ignore your need to urinate or hold urine for long periods of time.  You do not empty your bladder completely during urination.  You wipe back to front after urinating or having a bowel movement, if you are female.  You are uncircumcised, if you are female.  You are constipated.  You have a urinary catheter that stays in place (indwelling).  You have a weak defense (immune) system.  You have a medical condition that affects your bowels, kidneys, or bladder.  You have diabetes.  You take antibiotic medicines frequently or for long periods of time, and the antibiotics no longer work well against certain types of infections (antibiotic resistance).  You take medicines that irritate your urinary tract.  You are exposed to chemicals that irritate your urinary tract.  You are female. What are the signs or symptoms? Symptoms of this condition include:  Fever.  Frequent urination or passing small amounts of urine frequently.  Needing to urinate urgently.  Pain or burning with urination.  Urine that  smells bad or unusual.  Cloudy urine.  Pain in the lower abdomen or back.  Trouble urinating.  Blood in the urine.  Vomiting or being less hungry than normal.  Diarrhea or abdominal pain.  Vaginal discharge, if you are female. How is this diagnosed? This condition is diagnosed with a medical history and physical exam. You will also need to  provide a urine sample to test your urine. Other tests may be done, including:  Blood tests.  Sexually transmitted disease (STD) testing. If you have had more than one UTI, a cystoscopy or imaging studies may be done to determine the cause of the infections. How is this treated? Treatment for this condition often includes a combination of two or more of the following:  Antibiotic medicine.  Other medicines to treat less common causes of UTI.  Over-the-counter medicines to treat pain.  Drinking enough water to stay hydrated. Follow these instructions at home:  Take over-the-counter and prescription medicines only as told by your health care provider.  If you were prescribed an antibiotic, take it as told by your health care provider. Do not stop taking the antibiotic even if you start to feel better.  Avoid alcohol, caffeine, tea, and carbonated beverages. They can irritate your bladder.  Drink enough fluid to keep your urine clear or pale yellow.  Keep all follow-up visits as told by your health care provider. This is important.  Make sure to:  Empty your bladder often and completely. Do not hold urine for long periods of time.  Empty your bladder before and after sex.  Wipe from front to back after a bowel movement if you are female. Use each tissue one time when you wipe. Contact a health care provider if:  You have back pain.  You have a fever.  You feel nauseous or vomit.  Your symptoms do not get better after 3 days.  Your symptoms go away and then return. Get help right away if:  You have severe back pain or  lower abdominal pain.  You are vomiting and cannot keep down any medicines or water. This information is not intended to replace advice given to you by your health care provider. Make sure you discuss any questions you have with your health care provider. Document Released: 01/26/2005 Document Revised: 09/30/2015 Document Reviewed: 03/09/2015 Elsevier Interactive Patient Education  2017 [employer] Inc[employer]    Asthma Attack Prevention, Adult Although you may not be able to control the fact that you have asthma, you can take actions to prevent episodes of asthma (asthma attacks). These actions include:  Creating a written plan for managing and treating your asthma attacks (asthma action plan).  Monitoring your asthma.  Avoiding things that can irritate your airways or make your asthma symptoms worse (asthma triggers).  Taking your medicines as directed.  Acting quickly if you have signs or symptoms of an asthma attack. What are some ways to prevent an asthma attack? Create a plan  Work with your health care provider to create an asthma action plan. This plan should include:  A list of your asthma triggers and how to avoid them.  A list of symptoms that you experience during an asthma attack.  Information about when to take medicine and how much medicine to take.  Information to help you understand your peak flow measurements.  Contact information for your health care providers.  Daily actions that you can take to control asthma. Monitor your asthma   To monitor your asthma:  Use your peak flow meter every morning and every evening for 2-3 weeks. Record the results in a journal. A drop in your peak flow numbers on one or more days may mean that you are starting to have an asthma attack, even if you are not having symptoms.  When you have asthma symptoms, write them down in a journal. Avoid asthma triggers  Work with your health care provider to find out what your asthma triggers  are. This can be done by:  Being tested for allergies.  Keeping a journal that notes when asthma attacks occur and what may have contributed to them.  Asking your health care provider whether other medical conditions make your asthma worse. Common asthma triggers include:  Dust.  Smoke. This includes campfire smoke and secondhand smoke from tobacco products.  Pet dander.  Trees, grasses or pollens.  Very cold, dry, or humid air.  Mold.  Foods that contain high amounts of sulfites.  Strong smells.  Engine exhaust and air pollution.  Aerosol sprays and fumes from household cleaners.  Household pests and their droppings, including dust mites and cockroaches.  Certain medicines, including NSAIDs. Once you have determined your asthma triggers, take steps to avoid them. Depending on your triggers, you may be able to reduce the chance of an asthma attack by:  Keeping your home clean. Have someone dust and vacuum your home for you 1 or 2 times a week. If possible, have them use a high-efficiency particulate arrestance (HEPA) vacuum.  Washing your sheets weekly in hot water.  Using allergy-proof mattress covers and casings on your bed.  Keeping pets out of your home.  Taking care of mold and water problems in your home.  Avoiding areas where people smoke.  Avoiding using strong perfumes or odor sprays.  Avoid spending a lot of time outdoors when pollen counts are high and on very windy days.  Talking with your health care provider before stopping or starting any new medicines. Medicines  Take over-the-counter and prescription medicines only as told by your health care provider. Many asthma attacks can be prevented by carefully following your medicine schedule. Taking your medicines correctly is especially important when you cannot avoid certain asthma triggers. Even if you are doing well, do not stop taking your medicine and do not take less medicine. Act quickly  If an  asthma attack happens, acting quickly can decrease how severe it is and how long it lasts. Take these actions:  Pay attention to your symptoms. If you are coughing, wheezing, or having difficulty breathing, do not wait to see if your symptoms go away on their own. Follow your asthma action plan.  If you have followed your asthma action plan and your symptoms are not improving, call your health care provider or seek immediate medical care at the nearest hospital. It is important to write down how often you need to use your fast-acting rescue inhaler. You can track how often you use an inhaler in your journal. If you are using your rescue inhaler more often, it may mean that your asthma is not under control. Adjusting your asthma treatment plan may help you to prevent future asthma attacks and help you to gain better control of your condition. How can I prevent an asthma attack when I exercise?   Exercise is a common asthma trigger. To prevent asthma attacks during exercise:  Follow advice from your health care provider about whether you should use your fast-acting inhaler before exercising. Many people with asthma experience exercise-induced bronchoconstriction (EIB). This condition often worsens during vigorous exercise in cold, humid, or dry environments. Usually, people with EIB can stay very active by using a fast-acting inhaler before exercising.  Avoid exercising outdoors in very cold or humid weather.  Avoid exercising outdoors when pollen counts are high.  Warm up and cool down when exercising.  Stop exercising right away  if asthma symptoms start. Consider taking part in exercises that are less likely to cause asthma symptoms such as:  Indoor swimming.  Biking.  Walking.  Hiking.  Playing football. This information is not intended to replace advice given to you by your health care provider. Make sure you discuss any questions you have with your health care provider. Document  Released: 04/06/2009 Document Revised: 12/18/2015 Document Reviewed: 10/03/2015 Elsevier Interactive Patient Education  2017 [employer].   I personally performed the services described in this documentation, which was scribed in my presence. The recorded information has been reviewed and considered for accuracy and completeness, addended by me as needed, and agree with information above.  Signed,   Meredith Staggers, MD Primary Care at [facility].  09/13/16 1:00 PM

## 2016-09-13 NOTE — Patient Instructions (Addendum)
Start Keflex one pill twice per day for 10 days. Make sure to drink plenty of fluids. I will check a urine culture, and will let you know if any medication changes needed. If you are worsening, return for recheck.  For asthma, okay to use albuterol if needed for breakthrough symptoms.  If you do have need for that medication more than twice per week or any nighttime symptoms, return to discuss other treatment.   Return to the clinic or go to the nearest emergency room if any of your symptoms worsen or new symptoms occur.    Urinary Tract Infection, Adult A urinary tract infection (UTI) is an infection of any part of the urinary tract, which includes the kidneys, ureters, bladder, and urethra. These organs make, store, and get rid of urine in the body. UTI can be a bladder infection (cystitis) or kidney infection (pyelonephritis). What are the causes? This infection may be caused by fungi, viruses, or bacteria. Bacteria are the most common cause of UTIs. This condition can also be caused by repeated incomplete emptying of the bladder during urination. What increases the risk? This condition is more likely to develop if:  You ignore your need to urinate or hold urine for long periods of time.  You do not empty your bladder completely during urination.  You wipe back to front after urinating or having a bowel movement, if you are female.  You are uncircumcised, if you are female.  You are constipated.  You have a urinary catheter that stays in place (indwelling).  You have a weak defense (immune) system.  You have a medical condition that affects your bowels, kidneys, or bladder.  You have diabetes.  You take antibiotic medicines frequently or for long periods of time, and the antibiotics no longer work well against certain types of infections (antibiotic resistance).  You take medicines that irritate your urinary tract.  You are exposed to chemicals that irritate your urinary  tract.  You are female. What are the signs or symptoms? Symptoms of this condition include:  Fever.  Frequent urination or passing small amounts of urine frequently.  Needing to urinate urgently.  Pain or burning with urination.  Urine that smells bad or unusual.  Cloudy urine.  Pain in the lower abdomen or back.  Trouble urinating.  Blood in the urine.  Vomiting or being less hungry than normal.  Diarrhea or abdominal pain.  Vaginal discharge, if you are female. How is this diagnosed? This condition is diagnosed with a medical history and physical exam. You will also need to provide a urine sample to test your urine. Other tests may be done, including:  Blood tests.  Sexually transmitted disease (STD) testing. If you have had more than one UTI, a cystoscopy or imaging studies may be done to determine the cause of the infections. How is this treated? Treatment for this condition often includes a combination of two or more of the following:  Antibiotic medicine.  Other medicines to treat less common causes of UTI.  Over-the-counter medicines to treat pain.  Drinking enough water to stay hydrated. Follow these instructions at home:  Take over-the-counter and prescription medicines only as told by your health care provider.  If you were prescribed an antibiotic, take it as told by your health care provider. Do not stop taking the antibiotic even if you start to feel better.  Avoid alcohol, caffeine, tea, and carbonated beverages. They can irritate your bladder.  Drink enough fluid to keep your  urine clear or pale yellow.  Keep all follow-up visits as told by your health care provider. This is important.  Make sure to:  Empty your bladder often and completely. Do not hold urine for long periods of time.  Empty your bladder before and after sex.  Wipe from front to back after a bowel movement if you are female. Use each tissue one time when you  wipe. Contact a health care provider if:  You have back pain.  You have a fever.  You feel nauseous or vomit.  Your symptoms do not get better after 3 days.  Your symptoms go away and then return. Get help right away if:  You have severe back pain or lower abdominal pain.  You are vomiting and cannot keep down any medicines or water. This information is not intended to replace advice given to you by your health care provider. Make sure you discuss any questions you have with your health care provider. Document Released: 01/26/2005 Document Revised: 09/30/2015 Document Reviewed: 03/09/2015 Elsevier Interactive Patient Education  2017 [employer]    Asthma Attack Prevention, Adult Although you may not be able to control the fact that you have asthma, you can take actions to prevent episodes of asthma (asthma attacks). These actions include:  Creating a written plan for managing and treating your asthma attacks (asthma action plan).  Monitoring your asthma.  Avoiding things that can irritate your airways or make your asthma symptoms worse (asthma triggers).  Taking your medicines as directed.  Acting quickly if you have signs or symptoms of an asthma attack. What are some ways to prevent an asthma attack? Create a plan  Work with your health care provider to create an asthma action plan. This plan should include:  A list of your asthma triggers and how to avoid them.  A list of symptoms that you experience during an asthma attack.  Information about when to take medicine and how much medicine to take.  Information to help you understand your peak flow measurements.  Contact information for your health care providers.  Daily actions that you can take to control asthma. Monitor your asthma   To monitor your asthma:  Use your peak flow meter every morning and every evening for 2-3 weeks. Record the results in a journal. A drop in your peak flow numbers on one or  more days may mean that you are starting to have an asthma attack, even if you are not having symptoms.  When you have asthma symptoms, write them down in a journal. Avoid asthma triggers   Work with your health care provider to find out what your asthma triggers are. This can be done by:  Being tested for allergies.  Keeping a journal that notes when asthma attacks occur and what may have contributed to them.  Asking your health care provider whether other medical conditions make your asthma worse. Common asthma triggers include:  Dust.  Smoke. This includes campfire smoke and secondhand smoke from tobacco products.  Pet dander.  Trees, grasses or pollens.  Very cold, dry, or humid air.  Mold.  Foods that contain high amounts of sulfites.  Strong smells.  Engine exhaust and air pollution.  Aerosol sprays and fumes from household cleaners.  Household pests and their droppings, including dust mites and cockroaches.  Certain medicines, including NSAIDs. Once you have determined your asthma triggers, take steps to avoid them. Depending on your triggers, you may be able to reduce the chance of  an asthma attack by:  Keeping your home clean. Have someone dust and vacuum your home for you 1 or 2 times a week. If possible, have them use a high-efficiency particulate arrestance (HEPA) vacuum.  Washing your sheets weekly in hot water.  Using allergy-proof mattress covers and casings on your bed.  Keeping pets out of your home.  Taking care of mold and water problems in your home.  Avoiding areas where people smoke.  Avoiding using strong perfumes or odor sprays.  Avoid spending a lot of time outdoors when pollen counts are high and on very windy days.  Talking with your health care provider before stopping or starting any new medicines. Medicines  Take over-the-counter and prescription medicines only as told by your health care provider. Many asthma attacks can be  prevented by carefully following your medicine schedule. Taking your medicines correctly is especially important when you cannot avoid certain asthma triggers. Even if you are doing well, do not stop taking your medicine and do not take less medicine. Act quickly  If an asthma attack happens, acting quickly can decrease how severe it is and how long it lasts. Take these actions:  Pay attention to your symptoms. If you are coughing, wheezing, or having difficulty breathing, do not wait to see if your symptoms go away on their own. Follow your asthma action plan.  If you have followed your asthma action plan and your symptoms are not improving, call your health care provider or seek immediate medical care at the nearest hospital. It is important to write down how often you need to use your fast-acting rescue inhaler. You can track how often you use an inhaler in your journal. If you are using your rescue inhaler more often, it may mean that your asthma is not under control. Adjusting your asthma treatment plan may help you to prevent future asthma attacks and help you to gain better control of your condition. How can I prevent an asthma attack when I exercise?   Exercise is a common asthma trigger. To prevent asthma attacks during exercise:  Follow advice from your health care provider about whether you should use your fast-acting inhaler before exercising. Many people with asthma experience exercise-induced bronchoconstriction (EIB). This condition often worsens during vigorous exercise in cold, humid, or dry environments. Usually, people with EIB can stay very active by using a fast-acting inhaler before exercising.  Avoid exercising outdoors in very cold or humid weather.  Avoid exercising outdoors when pollen counts are high.  Warm up and cool down when exercising.  Stop exercising right away if asthma symptoms start. Consider taking part in exercises that are less likely to cause asthma  symptoms such as:  Indoor swimming.  Biking.  Walking.  Hiking.  Playing football. This information is not intended to replace advice given to you by your health care provider. Make sure you discuss any questions you have with your health care provider. Document Released: 04/06/2009 Document Revised: 12/18/2015 Document Reviewed: 10/03/2015 Elsevier Interactive Patient Education  2017 [employer]

## 2016-09-15 LAB — URINE CULTURE

## 2017-02-23 ENCOUNTER — Ambulatory Visit: Payer: Medicare Other | Admitting: Urgent Care
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
